# Patient Record
Sex: Female | Born: 1970
Health system: Southern US, Community
[De-identification: ages and names within clinical notes are randomized; demographics above are authoritative.]

## PROBLEM LIST (undated history)

## (undated) DIAGNOSIS — B009 Herpesviral infection, unspecified: Secondary | ICD-10-CM

## (undated) DIAGNOSIS — R112 Nausea with vomiting, unspecified: Secondary | ICD-10-CM

## (undated) DIAGNOSIS — F32A Depression, unspecified: Secondary | ICD-10-CM

## (undated) DIAGNOSIS — K8020 Calculus of gallbladder without cholecystitis without obstruction: Secondary | ICD-10-CM

## (undated) DIAGNOSIS — K831 Obstruction of bile duct: Secondary | ICD-10-CM

## (undated) DIAGNOSIS — Z9889 Other specified postprocedural states: Secondary | ICD-10-CM

## (undated) DIAGNOSIS — B019 Varicella without complication: Secondary | ICD-10-CM

## (undated) DIAGNOSIS — F329 Major depressive disorder, single episode, unspecified: Secondary | ICD-10-CM

## (undated) DIAGNOSIS — M810 Age-related osteoporosis without current pathological fracture: Secondary | ICD-10-CM

## (undated) HISTORY — PX: BREAST BIOPSY: SHX20

## (undated) HISTORY — PX: TUBAL LIGATION: SHX77

## (undated) HISTORY — DX: Varicella without complication: B01.9

## (undated) HISTORY — DX: Age-related osteoporosis without current pathological fracture: M81.0

## (undated) HISTORY — PX: WISDOM TOOTH EXTRACTION: SHX21

## (undated) HISTORY — DX: Depression, unspecified: F32.A

## (undated) HISTORY — DX: Herpesviral infection, unspecified: B00.9

## (undated) HISTORY — PX: APPENDECTOMY: SHX54

---
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
## 1999-10-14 ENCOUNTER — Other Ambulatory Visit: Admission: RE | Admit: 1999-10-14 | Discharge: 1999-10-14 | Payer: Self-pay | Admitting: Obstetrics and Gynecology

## 2000-01-21 ENCOUNTER — Other Ambulatory Visit: Admission: RE | Admit: 2000-01-21 | Discharge: 2000-01-21 | Payer: Self-pay | Admitting: Obstetrics and Gynecology

## 2000-10-20 ENCOUNTER — Other Ambulatory Visit: Admission: RE | Admit: 2000-10-20 | Discharge: 2000-10-20 | Payer: Self-pay | Admitting: Obstetrics and Gynecology

## 2001-10-27 ENCOUNTER — Other Ambulatory Visit: Admission: RE | Admit: 2001-10-27 | Discharge: 2001-10-27 | Payer: Self-pay | Admitting: Obstetrics and Gynecology

## 2002-07-14 HISTORY — PX: APPENDECTOMY: SHX54

## 2002-07-17 ENCOUNTER — Encounter: Payer: Self-pay | Admitting: Emergency Medicine

## 2002-07-17 ENCOUNTER — Observation Stay (HOSPITAL_COMMUNITY): Admission: EM | Admit: 2002-07-17 | Discharge: 2002-07-18 | Payer: Self-pay | Admitting: Emergency Medicine

## 2002-07-18 ENCOUNTER — Encounter (INDEPENDENT_AMBULATORY_CARE_PROVIDER_SITE_OTHER): Payer: Self-pay | Admitting: *Deleted

## 2002-08-04 ENCOUNTER — Other Ambulatory Visit: Admission: RE | Admit: 2002-08-04 | Discharge: 2002-08-04 | Payer: Self-pay | Admitting: Obstetrics and Gynecology

## 2003-03-06 ENCOUNTER — Inpatient Hospital Stay (HOSPITAL_COMMUNITY): Admission: AD | Admit: 2003-03-06 | Discharge: 2003-03-09 | Payer: Self-pay | Admitting: Obstetrics and Gynecology

## 2003-03-20 ENCOUNTER — Encounter: Admission: RE | Admit: 2003-03-20 | Discharge: 2003-04-19 | Payer: Self-pay | Admitting: Obstetrics and Gynecology

## 2003-04-12 ENCOUNTER — Other Ambulatory Visit: Admission: RE | Admit: 2003-04-12 | Discharge: 2003-04-12 | Payer: Self-pay | Admitting: Obstetrics and Gynecology

## 2003-12-13 ENCOUNTER — Other Ambulatory Visit: Admission: RE | Admit: 2003-12-13 | Discharge: 2003-12-13 | Payer: Self-pay | Admitting: Obstetrics and Gynecology

## 2004-06-17 ENCOUNTER — Inpatient Hospital Stay (HOSPITAL_COMMUNITY): Admission: AD | Admit: 2004-06-17 | Discharge: 2004-06-21 | Payer: Self-pay | Admitting: Obstetrics and Gynecology

## 2004-06-18 ENCOUNTER — Encounter (INDEPENDENT_AMBULATORY_CARE_PROVIDER_SITE_OTHER): Payer: Self-pay | Admitting: *Deleted

## 2004-07-29 ENCOUNTER — Other Ambulatory Visit: Admission: RE | Admit: 2004-07-29 | Discharge: 2004-07-29 | Payer: Self-pay | Admitting: Obstetrics and Gynecology

## 2005-09-15 ENCOUNTER — Other Ambulatory Visit: Admission: RE | Admit: 2005-09-15 | Discharge: 2005-09-15 | Payer: Self-pay | Admitting: Obstetrics and Gynecology

## 2008-07-14 HISTORY — PX: TUBAL LIGATION: SHX77

## 2009-04-25 ENCOUNTER — Emergency Department (HOSPITAL_BASED_OUTPATIENT_CLINIC_OR_DEPARTMENT_OTHER): Admission: EM | Admit: 2009-04-25 | Discharge: 2009-04-25 | Payer: Self-pay | Admitting: Emergency Medicine

## 2010-01-11 ENCOUNTER — Ambulatory Visit (HOSPITAL_COMMUNITY): Admission: RE | Admit: 2010-01-11 | Discharge: 2010-01-11 | Payer: Self-pay | Admitting: Obstetrics and Gynecology

## 2010-03-21 ENCOUNTER — Encounter: Admission: RE | Admit: 2010-03-21 | Discharge: 2010-03-21 | Payer: Self-pay | Admitting: Obstetrics and Gynecology

## 2010-03-26 ENCOUNTER — Encounter: Admission: RE | Admit: 2010-03-26 | Discharge: 2010-03-26 | Payer: Self-pay | Admitting: Obstetrics and Gynecology

## 2010-09-29 LAB — SURGICAL PCR SCREEN: MRSA, PCR: NEGATIVE

## 2010-09-29 LAB — CBC
HCT: 39.4 % (ref 36.0–46.0)
Hemoglobin: 13.4 g/dL (ref 12.0–15.0)
MCHC: 34 g/dL (ref 30.0–36.0)
MCV: 97.5 fL (ref 78.0–100.0)
RDW: 13.2 % (ref 11.5–15.5)
WBC: 6.2 10*3/uL (ref 4.0–10.5)

## 2010-11-29 NOTE — Discharge Summary (Signed)
NAME:  [name], [name] NO.:  [id number]   MEDICAL RECORD NO.:  [id number]                   PATIENT TYPE:   LOCATION:                                       FACILITY:   PHYSICIAN:  Duke Salvia. Marcelle Overlie, M.D.            DATE OF BIRTH:   DATE OF ADMISSION:  03/06/2003  DATE OF DISCHARGE:  03/09/2003                                 DISCHARGE SUMMARY   ADMITTING DIAGNOSES:  1. Intrauterine pregnancy at 40 5/7 weeks estimated gestational age.  2. Active labor.   DISCHARGE DIAGNOSES:  1. Status post low-transverse cesarean section secondary to cephalopelvic     disproportion.  2. Viable female infant.   PROCEDURE:  Primary low-transverse cesarean section.   REASON FOR ADMISSION:  Please see written H&P.   HOSPITAL COURSE:  Patient was a 40 year old married female, gravida 2, para  2, that was admitted to [facility] at 40 5/7 weeks who was  admitted initially for induction of labor.  Patient did present to the  hospital with spontaneous onset of labor with regular contractions.  Cervix  was noted to be 4 cm dilated.  Artificial rupture of membranes was performed  which revealed light meconium-stained fluid.  Fetal heart tones were  reassuring in the 130s.  Patient did progress to 7 cm dilated, completely  effaced, at a -2 station.  After several more hours of labor, decision was  made to proceed with a cesarean delivery for what was thought to be  cephalopelvic disproportion.  Patient was transferred to the operating room  where epidural anesthesia was dosed to an adequate surgical level.  A low-  transverse incision was made with delivery of a viable female infant weighing  8 pounds 13 ounces with Apgars of 9 at 1 minute and 9 at 5 minutes.  Patient  tolerated procedure well and was taken to the recovery room in stable  condition.  On postoperative day one, vital signs were stable.  Patient was  afebrile.  Abdomen was soft with good return  of bowel function.  Abdominal  dressing was noted to be clean, dry, and intact.  Fundus was firm and  nontender.  Labs revealed a hemoglobin of 11.8, platelet count 116,000, and  WBC count 16.2.  On postoperative day two, patient was without complaint.  Vital signs were stable.  She remained afebrile.  Abdomen was slightly  distended; however, she was having good return of bowel function.  Fundus  was firm and nontender.  She was ambulating well and tolerating a regular  diet without complaints such as nausea and vomiting.  On postoperative day  three, patient was doing well.  Vital signs were stable.  Abdomen was soft.  Fundus was firm and nontender.  Incision was clean, dry, and intact.  Staples were removed and patient was discharged home.   CONDITION ON DISCHARGE:  Good.   DIET:  Regular as tolerated.   ACTIVITY:  No heavy lifting.  No driving x2 weeks.  No vaginal entry.   FOLLOWUP:  Patient is to follow up in the office in one to two weeks for an  incision check.  She is to call for a temperature greater than 100 degrees,  persistent nausea and vomiting, heavy vaginal bleeding, and/or redness or  drainage from the incisional site.    DISCHARGE MEDICATIONS:  1. Percocet 5/325, #30, one p.o. four to six hours as needed.  2. Motrin 600 mg q.6h.  3. Prenatal vitamins one p.o. daily.  4. Colace one p.o. daily as needed.     Julio Sicks, N.P.                        Richard M. Marcelle Overlie, M.D.    CC/MEDQ  D:  03/22/2003  T:  03/22/2003  Job:  [id number]

## 2010-11-29 NOTE — Op Note (Signed)
[name], [name]                       ACCOUNT NO.:  [id number]   MEDICAL RECORD NO.:  [id number]                   PATIENT TYPE:  INP   LOCATION:  9109                                 FACILITY:  [facility]   PHYSICIAN:  Tracie Harrier, M.D.              DATE OF BIRTH:  May 23, 1971   DATE OF PROCEDURE:  DATE OF DISCHARGE:                                 OPERATIVE REPORT   PREOPERATIVE DIAGNOSES:  1. Intrauterine pregnancy at 40-5/7 weeks.  2. Cephalopelvic disproportion.  3. Light meconium.   POSTOPERATIVE DIAGNOSES:  1. Intrauterine pregnancy at 40-5/7 weeks.  2. Cephalopelvic disproportion.  3. Light meconium.   PROCEDURE:  Primary low transverse cesarean section.   SURGEON:  Tracie Harrier, M.D.   ANESTHESIA:  Epidural.   ESTIMATED BLOOD LOSS:  800 mL.   COMPLICATIONS:  None.   FINDINGS:  At the time of surgery, a viable female infant was delivered from  the vertex presentation.  The baby was a female, delivered at 5:32 p.m.,  weighing 8 lb., 13 oz.  Apgars were 9 and 9.   The pelvis was visualized at the time of surgery and noted to be normal.   PROCEDURE:  The patient was taken to the operating room where an adequate  level of epidural anesthetic was administered.  The patient was then placed  on the operating table in the left lateral tilt position, a Foley catheter  had previously been inserted.  The abdomen was then entered through a  Pfannenstiel incision and carried down sharply in the usual fashion.  The  peritoneum was atraumatically entered.  The vesicouterine peritoneum  overlying the lower uterine segment was incised, and a bladder flap was  bluntly and sharply created over the lower uterine segment.  A bladder blade  was then placed behind the bladder flap to ensure its protection during the  procedure.  The uterus was then entered through a low transverse incision  and carried out laterally using the operator's fingers.  Only scant light  meconium  was noted upon entry.  The vertex was elevated into the incision  and delivered promptly and easily at 17:32.  The oronasopharynx was  thoroughly suctioned with the DeLee suction, and only clear fluid was  aspirated.  No meconium was aspirated from the oronasopharynx as I could  see.  The baby was then promptly and easily delivered without difficulty.  The cord was doubly clamped and cut, and the baby was handed promptly to the  pediatricians.  Apgars were 9 and 9.  The baby did well.  The baby was a  female weighing 8 lb, 13 oz.   The placenta was then manually extracted intact with a three-vessel cord  without difficulty.  The interior of the uterus was wiped clean with a wet  sponge.  The uterine incision was then closed in a two-layered fashion, the  first layer a running interlocking suture of #  1 Vicryl.  A second  imbricating suture was placed across the primary suture line with a running  suture of #1 Vicryl as well.  The pelvis was thoroughly irrigated with  copious amounts of irrigant.  Hemostasis was noted.  The pelvis was also  visualized and noted to be normal.   The rectus muscle and anterior peritoneum were then closed with a running  suture of #1 Vicryl.  The subfascial layers were visualized and noted to be  hemostatic.  The fascia was then closed with a running suture of 0 PDS.  The  subcutaneous tissue was then irrigated and made hemostatic using the Bovie  cautery.  The skin reapproximated with staples and a sterile dressing  applied.   Final sponge, needle, and instrument count was correct x3.  There were no  perioperative complications.  The baby did well.  The patient also received  an intravenous antibiotic after delivery.                                               Tracie Harrier, M.D.    REG/MEDQ  D:  03/06/2003  T:  03/07/2003  Job:  [id number]

## 2010-11-29 NOTE — Discharge Summary (Signed)
NAMEJAIRY, [name]           ACCOUNT NO.:  [id number]   MEDICAL RECORD NO.:  [id number]          PATIENT TYPE:  INP   LOCATION:  9144                          FACILITY:  [facility]   PHYSICIAN:  Dineen Kid. Rana Snare, M.D.    DATE OF BIRTH:  1970/08/27   DATE OF ADMISSION:  06/17/2004  DATE OF DISCHARGE:  06/21/2004                                 DISCHARGE SUMMARY   ADMISSION DIAGNOSES:  1.  Intrauterine pregnancy at 36-1/2 weeks estimated gestational age.  2.      Pregnancy induced hypertension/HELLP syndrome.   DISCHARGE DIAGNOSES:  1.  Status post low transverse cesarean section.  2. Viable female infant.   PROCEDURE:  Repeat low transverse cesarean section.   REASON FOR ADMISSION:  Please see written H&P.   HOSPITAL COURSE:  The patient is a 40 year old white married female, gravida  3, para 3 with a history of twins that was admitted to [facility] at  36-1/2 weeks estimated gestational age for induction of labor.  The patient  had had a previous cesarean section and had been counseled regarding risks  associated with vaginal versus cesarean.  On the evening of admission,  Cervidil was placed.  However, the following morning after view of  laboratories and cervix being closed and thick, the decision was made to  proceed with a cesarean delivery.  The patient was then transferred to the  operating room where spinal anesthesia was administered without difficulty.  A low transverse incision was made and delivered a viable female infant  weighing 7 pounds 14 ounces with Apgars of 8 at 1 minute and 9 at 5 minutes.  Arterial cord pH was 7.28.  The patient tolerated the procedure and was  taken to the recovery room in stable condition.  After approximately two  hours in the recovery room, the patient was then transferred to the AICU  where she continued her magnesium sulfate.  On the following morning, vital  signs were stable.  Blood pressure is 120s to 130s over 80s to 90s.  Urine  output was good at 500-800 mL/hour.  Abdomen was soft with good return of  bowel function.  Fundus is firm and nontender.  Deep tendon reflexes are 2+  without clonus.   Laboratories reveal platelets of 119,000, creatinine of 1.1, SGOT 174, SGPT  is 218.  Magnesium sulfate was decreased to 1.5 g/hour.  She continued to be  monitored closely.  Later that evening, vital signs were stable.  Blood  pressure 130/90.  Urine output was 1100 mL over the last eight hours.  Laboratories revealed hemoglobin of 12.1, platelet count was up to 125,000.  WBC count was 11.8, SGOT was 123, and SGPT was 166.  The patient was stable  and was later transferred to the mother/baby unit.  On postoperative day #2,  the patient was without complaints.  She denied headache, blurred vision, or  right upper quadrant  pain.  Vital signs remained stable.  Blood pressure  124/86 to 129/91.  Deep tendon reflexes are 2+ without clonus.  Abdomen is  soft.  Fundus is firm and nontender.  Abdominal  dressing had been removed  revealing an incision that was clean, dry, and intact.  Laboratories  revealed hemoglobin of 11.8, platelet count was up to 137,000.  AST was down  to 73, ALT was down to 110.  LDH was down to 238.  Urine output was 1040 mL  over the last eight hours.  On postoperative day #3, the patient was without  complaint.  Vital signs remained stable.  Blood pressure was now 124/75.  Abdomen was soft.  Fundus was firm and nontender.  Incision was clean, dry,  and intact.  Staples removed.  The patient was discharged home.   CONDITION ON DISCHARGE:  Good.   DIET:  Regular as tolerated.   ACTIVITY:  No heavy lifting, no driving x 2 weeks.  No vaginal entry.   FOLLOW UP:  The patient is to follow up in the office in three to four days  for laboratories and blood pressure check.  She is to call for temperature  greater than 100 degrees, persistent nausea and vomiting, heavy vaginal  bleeding and/or redness or  drainage from the incisional site.  The patient  was also encouraged to call for headaches unrelieved by Tylenol, right upper  quadrant pain, or feeling jittery.   DISCHARGE MEDICATIONS:  1.  Tylox #30, 1 p.o. q.4-6h p.r.n.  2. Motrin 600 mg every 6 hours.  3.      Prenatal vitamins 1 p.o. daily.  4. Colace 1 p.o. daily p.r.n.     Caro   CC/MEDQ  D:  07/19/2004  T:  07/19/2004  Job:  [id number]

## 2010-11-29 NOTE — Op Note (Signed)
NAMELASONDRA, [name] Ingram           ACCOUNT NO.:  [id number]   MEDICAL RECORD NO.:  [id number]          PATIENT TYPE:  INP   LOCATION:  9374                          FACILITY:  [facility]   PHYSICIAN:  Guy Sandifer. Tomblin II, M.D.DATE OF BIRTH:  1971/03/29   DATE OF PROCEDURE:  06/18/2004  DATE OF DISCHARGE:                                 OPERATIVE REPORT   PREOPERATIVE DIAGNOSES:  1.  Intrauterine pregnancy at 54 1/2 weeks estimated gestational age.  2.  Previous cesarean section.  3.  Preeclampsia.   POSTOPERATIVE DIAGNOSES:  1.  Intrauterine pregnancy at 43 1/2 weeks estimated gestational age.  2.  Previous cesarean section.  3.  Preeclampsia.   PROCEDURE:  Repeat low transverse cesarean section.   SURGEON:  Guy Sandifer. Henderson Cloud, M.D.   ANESTHESIA:  Spinal, Quillian Quince, M.D.   ESTIMATED BLOOD LOSS:  750 mL.   SPECIMENS:  Placenta to pathology.   FINDINGS:  Viable female infant, Apgar's of 8 and 9 at 1 and 5 minutes  respectively.  Birth weight 7 pounds 14 ounces, arterial cord pH 7.28.   INDICATIONS AND CONSENTS:  This patient is a 40 year old, married white  female, G3, P3 (twins) with a due date of July 11, 2004 placing at 27  1/2 weeks estimated gestational age.  Prenatal care was complicated by a  history of a low transverse cesarean section with pregnancy #2 and a history  of preeclampsia with her first pregnancy with twins.  In the office, she is  noted to have proteinuria of  1+ and increased blood pressures.  With rest,  the protein resolved to trace.  However, laboratories were noted to have a  elevated liver functions, creatinine and uric acid. She was admitted last  p.m. for induction of labor.  Issues of VBAC have been carefully discussed  with the patient prior to that.  Cervidil is placed.  Blood pressures this  morning were 130's/80's.  Contractions were approximately 2-4 minutes apart,  moderate to palpation. The cervix was closed and thick. Laboratories  noted  liver functions of approximately 200, uric acid of 6.6, creatinine of 1.1  and platelets of 120,000.  LDH was 252.  Recommendation for delivery with  repeat cesarean section is made.  The potential risks and complications are  discussed preoperatively with the patient and her husband including but not  limited to infection, organ damage, bleeding and transfusion of blood  products, DVT, PE and pneumonia.  All questions are answered and consent is  signed on the chart.   DESCRIPTION OF PROCEDURE:  The patient was taken to the operating room where  she was identified, spinal anesthetic was placed and she was placed in the  dorsal supine position with a 15 degree left lateral wedge. She was then  prepped abdominally, Foley catheter was placed, the bladder was drained and  she was draped in a sterile fashion.  After testing for adequate spinal  anesthesia, the skin is entered through a Pfannenstiel incision by resecting  the old scar on the way in. Dissection is carried out in layers to the  peritoneum.  The  peritoneum is entered and extended superiorly and  inferiorly. The vesicouterine peritoneum is taken down cephalolaterally, the  bladder flap is developed and bladder blade is placed.  The uterus is then  incised in a low transverse fashion. The uterine cavity is entered bluntly  with a hemostat and the uterine incision is extended cephalolaterally with  the fingers.  Artificial rupture of the membranes with clear fluid is  carried out. The vertex is delivered and the oronasopharynx are thoroughly  suctioned.  The remainder of the baby is delivered and good cry and tone is  noted.  The cord is clamped and cut and baby is handed to the waiting  pediatrics team.  The placenta is manually delivered and sent to pathology.  The uterine cavity is cleaned.  The uterus is closed in two running locking  imbricating layers of #0 Monocryl suture which achieves good hemostasis. The  tubes and  ovaries palpate normally bilaterally. The anterior peritoneum is  closed in running fashion with #0 Monocryl suture which is also used to  reapproximate the pyramidalis muscle of the midline.  The anterior rectus  fascia is closed in a running fashion with #0 PDS suture and the skin is  closed with clips. All sponge, instrument and needle counts are correct and  the patient is transferred to the recovery room in stable condition.      JET/MEDQ  D:  06/18/2004  T:  06/18/2004  Job:  [id number]

## 2010-11-29 NOTE — Op Note (Signed)
[name], [name]                       ACCOUNT NO.:  [id number]   MEDICAL RECORD NO.:  [id number]                   PATIENT TYPE:  INP   LOCATION:  1823                                 FACILITY:  [facility]   PHYSICIAN:  Jimmye Norman III, M.D.               DATE OF BIRTH:  [birth year]   DATE OF PROCEDURE:  07/17/2002  DATE OF DISCHARGE:                                 OPERATIVE REPORT   PREOPERATIVE DIAGNOSIS:  Acute appendicitis at seven weeks pregnancy.   POSTOPERATIVE DIAGNOSIS:  Acute appendicitis at seven weeks pregnancy.   PROCEDURE:  Laparoscopic appendectomy.   SURGEON:  Jimmye Norman, M.D.   ASSISTANT:  None.   ANESTHESIA:  General endotracheal.   ESTIMATED BLOOD LOSS:  Less than 20 cc.   COMPLICATIONS:  None.   CONDITION:  Stable.   SPECIMENS:  Acutely inflamed appendix.   FINDINGS:  Acutely inflamed appendix without evidence of perforation.   INDICATIONS FOR PROCEDURE:  The patient is 31, seven weeks pregnant,  abdominal pain with nausea, vomiting, elevated white count and right lower  quadrant tenderness and a CT scan demonstrating acute appendicitis.   OPERATION:  The patient was taken to the operating room and placed on the  table in supine position.  After adequate endotracheal anesthetic was  administered she was prepped and draped in usual sterile manner and exposed  in the midline.   The patient had had a previous supraumbilical piercing which was included in  part of the supraumbilical transverse curvilinear incision down to the  midline fascia at the supraumbilical site.  It was through this site at the  fascia level that a Veress needle was passed into the peritoneal cavity  confirming its position using the saline test.  Once this was done, carbon  dioxide insufflation was instilled into the peritoneal cavity up to maximal  intraabdominal pressure of 15 mmHg.  Once this was done, right upper  quadrant costal margin 5-mm cannula and a suprapubic  11 to 12-mm cannula  passed into the peritoneal cavity under direct vision.  Extreme care was  taken in the suprapubic cannula site not to injure or even touch the uterus  upon entrance or during manipulation, and there was no problem with this at  all during the procedure.   The patient was placed in the Trendelenburg position, the left side was  tilted down.  The acutely inflamed appendix was seen in the right pericolic  area and it was dissected free from its mesoappendix at the base of the  cecum.  An ENDO-GIA with 3.5 mm stapler, __________ blue stapler was passed  across the base of the appendix and then stapled across detaching the  appendix from the base of the cecum.  We then used a 2.5 mm White  Endostapler across the base of the mesoappendix stapling it across and then  retracted the appendix out of the abdominal cavity using an EndoCatch bag  passed through the suprapubic cannula site.  Once this was done we irrigated  the right lower quadrant with a liter of saline solution.  There was minimal  bleeding and no cautery had to be used.  We inspected the uterus which did  not appear to be injured and seemed to be of appropriate size for this  pregnancy of stated gestation.   Once we had done this we allowed all gas to escape from the cannula sites.  We removed all cannulas.  The supraumbilical fascia was reapproximated using  a figure-of-eight stitch of 0 Vicryl.  The skin was closed using a running  subcuticular stitch of 4-0 Vicryl.  Then 0.25% Marcaine with epinephrine was  injected at all sites.  All needle counts, sponge counts and instrument  counts were correct.                                               Kathrin Ruddy, M.D.    JW/MEDQ  D:  07/17/2002  T:  07/17/2002  Job:  [id number]

## 2011-10-02 ENCOUNTER — Ambulatory Visit (HOSPITAL_BASED_OUTPATIENT_CLINIC_OR_DEPARTMENT_OTHER): Payer: Managed Care, Other (non HMO) | Admitting: Genetic Counselor

## 2011-10-02 ENCOUNTER — Ambulatory Visit: Payer: Managed Care, Other (non HMO) | Admitting: Lab

## 2011-10-02 ENCOUNTER — Emergency Department (HOSPITAL_BASED_OUTPATIENT_CLINIC_OR_DEPARTMENT_OTHER)
Admission: EM | Admit: 2011-10-02 | Discharge: 2011-10-03 | Disposition: A | Discharge status: Home / Self Care | Payer: Managed Care, Other (non HMO) | Source: Home / Self Care | Attending: Emergency Medicine | Admitting: Emergency Medicine

## 2011-10-02 ENCOUNTER — Encounter (HOSPITAL_BASED_OUTPATIENT_CLINIC_OR_DEPARTMENT_OTHER): Payer: Self-pay | Admitting: *Deleted

## 2011-10-02 DIAGNOSIS — Z800 Family history of malignant neoplasm of digestive organs: Secondary | ICD-10-CM

## 2011-10-02 DIAGNOSIS — R110 Nausea: Secondary | ICD-10-CM | POA: Insufficient documentation

## 2011-10-02 DIAGNOSIS — R10811 Right upper quadrant abdominal tenderness: Secondary | ICD-10-CM | POA: Insufficient documentation

## 2011-10-02 DIAGNOSIS — R1011 Right upper quadrant pain: Secondary | ICD-10-CM | POA: Insufficient documentation

## 2011-10-02 DIAGNOSIS — K8020 Calculus of gallbladder without cholecystitis without obstruction: Secondary | ICD-10-CM | POA: Insufficient documentation

## 2011-10-02 DIAGNOSIS — M549 Dorsalgia, unspecified: Secondary | ICD-10-CM | POA: Insufficient documentation

## 2011-10-02 LAB — URINALYSIS, ROUTINE W REFLEX MICROSCOPIC
Bilirubin Urine: NEGATIVE
Leukocytes, UA: NEGATIVE
Nitrite: NEGATIVE
Specific Gravity, Urine: 1.029 (ref 1.005–1.030)
Urobilinogen, UA: 0.2 mg/dL (ref 0.0–1.0)
pH: 5.5 (ref 5.0–8.0)

## 2011-10-02 NOTE — ED Notes (Signed)
Pt began having upper abd pain that radiates to her back denies N/V

## 2011-10-02 NOTE — Progress Notes (Signed)
Dr.  Henderson Cloud requested a consultation for genetic counseling and risk assessment for [name], a 41 y.o. female, for discussion of her family history of a PMS2 mutation for Lynch syndrome. She presents to clinic today to discuss the possibility of a genetic predisposition to cancer, and to further clarify her risks.   HISTORY OF PRESENT ILLNESS:  [name] is a 41 y.o. female with no personal history of cancer.     History  Substance Use Topics  . Smoking status: Not on file  . Smokeless tobacco: Not on file  . Alcohol Use: Not on file    PERSONAL RISK ASSESSMENT FACTORS: Uterus Intact: Yes Ovaries Intact: Yes Colonoscopy: No   FAMILY HISTORY:  We obtained a detailed, 4-generation family history.  Significant diagnoses are listed below: Patient's mother was diagnosed with colon cancer at age 20.  Genetic testing was performed and she was found to have a MMR mutation in the PMS2 gene.  Specifically, the mutation was [id number]. There is no other reported cancer history on the patients maternal or paternal side of the family.  GENETIC COUNSELING RISK ASSESSMENT, DISCUSSION, AND SUGGESTED FOLLOW UP: We reviewed the natural history and genetic etiology of sporadic, familial and hereditary cancer syndromes.  We discussed the red flags for Lynch syndrome including the related cancers, potential early age of diagnoses, and multiple generations affected.  We reviewed the dominant inheritance pattern for PMS2 mutations, and other family members who are at risk.  The patient's family history is suggestive of the following possible diagnosis: Lynch Syndrome  We discussed that identification of a hereditary cancer syndrome may help her care providers tailor the patients medical management. If a mutation indicating Lynch syndrome is detected in this case, the [employer] recommendations would include increased cancer screenings and discussion of  prophylactic surgery. If a mutation is detected, the patient will be referred back to the referring provider and to any additional appropriate care providers to discuss the relevant options.   If a mutation is not found in the patient, then she would not have inherited the gene mutation for Lynch syndrome that is found in her family.  We would recommend the general cancer screening guidelines as recommended by the [facility] and American Cancer Society guidelines, with consideration of their personal and family history risk factors. In this case, the patient will be referred back to their care providers for discussions of management.   Based on the patient's family history, her risk of inheriting a PMS2 mutation is 50%.  After considering the risks, benefits, and limitations, the patient provided informed consent for  the following  Testing: Single site Lynch syndrome testing of the PMS2 gene through [facility].   Per the patient's request, we will contact her by telephone to discuss these results. A follow up genetic counseling visit will be scheduled if indicated.  The patient was seen for a total of 60 minutes, greater than 50% of which was spent face-to-face counseling.  This plan is being carried out per Dr. Huel Coventry recommendations.  This note will also be sent to the referring provider via the electronic medical record. The patient will be supplied with a summary of this genetic counseling discussion as well as educational information on the discussed hereditary cancer syndromes following the conclusion of their visit.   Patient was discussed with Dr. Drue Second.  EDUCATIONAL INFORMATION SUPPLIED TO PATIENT AT ENCOUNTER:  Lynch Syndrome Brochure   _______________________________________________________________________ For Office Staff:  Number of people involved in session: 2 Was an Intern/ student involved with case: not applicable }

## 2011-10-03 ENCOUNTER — Emergency Department (HOSPITAL_COMMUNITY): Payer: Managed Care, Other (non HMO)

## 2011-10-03 ENCOUNTER — Encounter (HOSPITAL_COMMUNITY): Payer: Self-pay | Admitting: *Deleted

## 2011-10-03 ENCOUNTER — Emergency Department (INDEPENDENT_AMBULATORY_CARE_PROVIDER_SITE_OTHER): Payer: Managed Care, Other (non HMO)

## 2011-10-03 ENCOUNTER — Emergency Department (HOSPITAL_COMMUNITY)
Admission: EM | Admit: 2011-10-03 | Discharge: 2011-10-03 | Disposition: A | Discharge status: Home / Self Care | Payer: Managed Care, Other (non HMO) | Attending: Emergency Medicine | Admitting: Emergency Medicine

## 2011-10-03 DIAGNOSIS — R109 Unspecified abdominal pain: Secondary | ICD-10-CM

## 2011-10-03 DIAGNOSIS — K8020 Calculus of gallbladder without cholecystitis without obstruction: Secondary | ICD-10-CM

## 2011-10-03 DIAGNOSIS — K8050 Calculus of bile duct without cholangitis or cholecystitis without obstruction: Secondary | ICD-10-CM

## 2011-10-03 LAB — BASIC METABOLIC PANEL
BUN: 13 mg/dL (ref 6–23)
Chloride: 102 mEq/L (ref 96–112)
GFR calc Af Amer: >90 mL/min (ref >90)
GFR calc non Af Amer: >90 mL/min (ref >90)
Potassium: 4.2 mEq/L (ref 3.5–5.1)
Sodium: 138 mEq/L (ref 135–145)

## 2011-10-03 LAB — CBC
HCT: 35.3 % — ABNORMAL LOW (ref 36.0–46.0)
Hemoglobin: 12.2 g/dL (ref 12.0–15.0)
MCV: 90.7 fL (ref 78.0–100.0)
RBC: 3.89 MIL/uL (ref 3.87–5.11)
WBC: 10.9 10*3/uL — ABNORMAL HIGH (ref 4.0–10.5)

## 2011-10-03 LAB — HEPATIC FUNCTION PANEL
ALT: 16 U/L (ref 0–35)
AST: 23 U/L (ref 0–37)
Albumin: 4.3 g/dL (ref 3.5–5.2)
Alkaline Phosphatase: 72 U/L (ref 39–117)
Total Protein: 7.5 g/dL (ref 6.0–8.3)

## 2011-10-03 LAB — LIPASE, BLOOD: Lipase: 16 U/L (ref 11–59)

## 2011-10-03 MED ORDER — OXYCODONE-ACETAMINOPHEN 5-325 MG PO TABS
2.0000 | ORAL_TABLET | Freq: Once | ORAL | Status: AC
  Administered 2011-10-03: 2 via ORAL
  Filled 2011-10-03: qty 2

## 2011-10-03 MED ORDER — ONDANSETRON HCL 4 MG/2ML IJ SOLN
4.0000 mg | Freq: Once | INTRAMUSCULAR | Status: AC
  Administered 2011-10-03: 4 mg via INTRAVENOUS
  Filled 2011-10-03: qty 2

## 2011-10-03 MED ORDER — ONDANSETRON HCL 4 MG PO TABS: 4.0000 mg | ORAL_TABLET | Freq: Four times a day (QID) | ORAL | Status: DC

## 2011-10-03 MED ORDER — FENTANYL CITRATE 0.05 MG/ML IJ SOLN: 100.0000 ug | Freq: Once | INTRAMUSCULAR | Status: DC

## 2011-10-03 MED ORDER — GI COCKTAIL ~~LOC~~
30.0000 mL | Freq: Once | ORAL | Status: DC
  Filled 2011-10-03: qty 30

## 2011-10-03 MED ORDER — SODIUM CHLORIDE 0.9 % IV SOLN
INTRAVENOUS | Status: DC
  Administered 2011-10-03: 1000 mL via INTRAVENOUS

## 2011-10-03 MED ORDER — GI COCKTAIL ~~LOC~~
30.0000 mL | Freq: Once | ORAL | Status: AC
  Administered 2011-10-03: 30 mL via ORAL
  Filled 2011-10-03: qty 30

## 2011-10-03 MED ORDER — FENTANYL CITRATE 0.05 MG/ML IJ SOLN
100.0000 ug | Freq: Once | INTRAMUSCULAR | Status: AC
  Administered 2011-10-03: 100 ug via INTRAVENOUS
  Filled 2011-10-03: qty 2

## 2011-10-03 MED ORDER — OXYCODONE-ACETAMINOPHEN 5-325 MG PO TABS: 1.0000 | ORAL_TABLET | Freq: Four times a day (QID) | ORAL | Status: DC | PRN

## 2011-10-03 MED ORDER — MORPHINE SULFATE 4 MG/ML IJ SOLN
4.0000 mg | Freq: Once | INTRAMUSCULAR | Status: AC
  Administered 2011-10-03: 4 mg via INTRAVENOUS
  Filled 2011-10-03: qty 1

## 2011-10-03 MED ORDER — ONDANSETRON 4 MG PO TBDP
4.0000 mg | ORAL_TABLET | Freq: Once | ORAL | Status: AC
  Administered 2011-10-03: 4 mg via ORAL
  Filled 2011-10-03: qty 1

## 2011-10-03 MED ORDER — FENTANYL CITRATE 0.05 MG/ML IJ SOLN
50.0000 ug | Freq: Once | INTRAMUSCULAR | Status: AC
  Administered 2011-10-03: 100 ug via INTRAVENOUS
  Filled 2011-10-03: qty 2

## 2011-10-03 NOTE — Discharge Instructions (Signed)

## 2011-10-03 NOTE — ED Notes (Signed)
Pt transported to XR at this time.

## 2011-10-03 NOTE — ED Notes (Signed)
Spoke to Consulting civil engineer at [employer] to expect patients arrival. [place] they will be ready for her on arrival.

## 2011-10-03 NOTE — ED Notes (Signed)
Pt c/o abdominal pain that started @ 0500 yesterday, approximately 30 minutes after eating a delicious cupcake. Pt states that the pain is epigastric in location and there is some referred pain in her LUQ.

## 2011-10-03 NOTE — ED Provider Notes (Signed)
History     CSN: [id number]  Arrival date & time 10/02/11  2231   First MD Initiated Contact with Patient 10/03/11 0001      Chief Complaint  Patient presents with  . Abdominal Pain    (Consider location/radiation/quality/duration/timing/severity/associated sxs/prior treatment) Patient is a 41 y.o. female presenting with abdominal pain. The history is provided by the patient. No language interpreter was used.  Abdominal Pain The primary symptoms of the illness include abdominal pain and nausea. The current episode started 6 to 12 hours ago. The onset of the illness was sudden. The problem has not changed since onset. The abdominal pain began 6 to 12 hours ago. The pain came on suddenly. The abdominal pain has been unchanged since its onset. The abdominal pain is located in the RUQ. Pain radiation: upper back. The severity of the abdominal pain is 10/10. The abdominal pain is relieved by nothing. Exacerbated by: nothing.  Associated with: eating a cupcake. The patient states that she believes she is currently not pregnant. The patient has not had a change in bowel habit. Risk factors for an acute abdominal problem include a history of abdominal surgery. Symptoms associated with the illness do not include chills. Significant associated medical issues do not include PUD.  No chest pain no SOB  History reviewed. No pertinent past medical history.  Past Surgical History  Procedure Date  . Tummy    . Cesarean section   . Appendectomy     History reviewed. No pertinent family history.  History  Substance Use Topics  . Smoking status: Never Smoker   . Smokeless tobacco: Not on file  . Alcohol Use: No    OB History    Grav Para Term Preterm Abortions TAB SAB Ect Mult Living                  Review of Systems  Constitutional: Negative for chills.  HENT: Negative.   Eyes: Negative.   Respiratory: Negative.   Cardiovascular: Negative.   Gastrointestinal: Positive for nausea  and abdominal pain.  Genitourinary: Negative.   Musculoskeletal: Negative.   Skin: Negative.   Neurological: Negative.   Hematological: Negative.   Psychiatric/Behavioral: Negative.     Allergies  Review of patient's allergies indicates no known allergies.  Home Medications  No current outpatient prescriptions on file.  BP 125/72  Pulse 72  Temp(Src) 98 F (36.7 C) (Oral)  Resp 16  SpO2 99%  LMP 09/22/2011  Physical Exam  Constitutional: She is oriented to person, place, and time. She appears well-developed and well-nourished. No distress.  HENT:  Head: Normocephalic and atraumatic.  Mouth/Throat: Oropharynx is clear and moist.  Eyes: Conjunctivae are normal. Pupils are equal, round, and reactive to light.  Neck: Normal range of motion. Neck supple.  Cardiovascular: Normal rate and regular rhythm.   Pulmonary/Chest: Effort normal and breath sounds normal.  Abdominal: Soft. Bowel sounds are normal. There is tenderness in the right upper quadrant. There is no rigidity and no guarding.  Musculoskeletal: Normal range of motion.  Neurological: She is alert and oriented to person, place, and time.  Skin: Skin is warm.  Psychiatric: She has a normal mood and affect.    ED Course  Procedures (including critical care time)  Labs Reviewed  BASIC METABOLIC PANEL - Abnormal; Notable for the following:    Glucose, Bld 121 (*)    All other components within normal limits  CBC - Abnormal; Notable for the following:    WBC 10.9 (*)  HCT 35.3 (*)    All other components within normal limits  HEPATIC FUNCTION PANEL - Abnormal; Notable for the following:    Total Bilirubin 0.2 (*)    All other components within normal limits  URINALYSIS, ROUTINE W REFLEX MICROSCOPIC  LIPASE, BLOOD  PREGNANCY, URINE   Dg Abd Acute W/chest  10/03/2011  *RADIOLOGY REPORT*  Clinical Data: Pain.  ACUTE ABDOMEN SERIES (ABDOMEN 2 VIEW & CHEST 1 VIEW)  Comparison: 02/26/2011  Findings: The heart size  and mediastinal contours are within normal limits.  Both lungs are clear.  The visualized skeletal structures are unremarkable.  The bowel gas pattern appears nonobstructed.  No dilated loops of small bowel or fluid levels identified.  Gas and stool identified within the colon.  IMPRESSION:  1.  Nonobstructive bowel gas pattern. 2.  No active cardiopulmonary abnormalities.  Original Report Authenticated By: Rosealee Albee, M.D.     No diagnosis found.    MDM  Case d/w Dr. Brooke Dare patient to go POV to [facility] for [place]        Leather Estis K Cuca Benassi-Rasch, MD 10/03/11 [phone]

## 2011-10-03 NOTE — ED Notes (Signed)
DR Nicanor Alcon at bedside.

## 2011-10-03 NOTE — ED Notes (Signed)
Pt to be discharged and f/u at [facility] ED tonight for f/u US of gallbladder.

## 2011-10-03 NOTE — ED Provider Notes (Signed)
History     CSN: [id number]  Arrival date & time 10/03/11  0343   First MD Initiated Contact with Patient 10/03/11 0410      Chief Complaint  Patient presents with  . Abdominal Pain    (Consider location/radiation/quality/duration/timing/severity/associated sxs/prior treatment) HPI Patient is a 41 y.o. female presenting with abdominal pain. The history is provided by the patient. No language interpreter was used.  Abdominal Pain  The primary symptoms of the illness include abdominal pain and nausea. The current episode started 6 to 12 hours ago. The onset of the illness was sudden. The problem has not changed since onset.  The abdominal pain began 6 to 12 hours ago. The pain came on suddenly. The abdominal pain has been unchanged since its onset. The abdominal pain is located in the RUQ. Pain radiation: upper back. The severity of the abdominal pain is 10/10. The abdominal pain is relieved by nothing. Exacerbated by: nothing.  Associated with: eating a cupcake. The patient states that she believes she is currently not pregnant. The patient has not had a change in bowel habit. Risk factors for an acute abdominal problem include a history of abdominal surgery. Symptoms associated with the illness do not include chills. Significant associated medical issues do not include PUD.  No chest pain no SOB  History reviewed. No pertinent past medical history.  Past Surgical History  Procedure Date  . Tummy    . Cesarean section   . Appendectomy     No family history on file.  History  Substance Use Topics  . Smoking status: Never Smoker   . Smokeless tobacco: Not on file  . Alcohol Use: No    OB History    Grav Para Term Preterm Abortions TAB SAB Ect Mult Living                  Review of Systems  Constitutional: Negative for fever, activity change, appetite change and fatigue.  HENT: Negative for congestion, sore throat, rhinorrhea, neck pain and neck stiffness.   Respiratory:  Negative for cough and shortness of breath.   Cardiovascular: Negative for chest pain and palpitations.  Gastrointestinal: Positive for nausea and abdominal pain. Negative for vomiting.  Genitourinary: Negative for dysuria, urgency, frequency and flank pain.  Musculoskeletal: Positive for back pain. Negative for myalgias and arthralgias.  Neurological: Negative for dizziness, weakness, light-headedness, numbness and headaches.  All other systems reviewed and are negative.    Allergies  Review of patient's allergies indicates no known allergies.  Home Medications   Current Outpatient Rx  Name Route Sig Dispense Refill  . ONDANSETRON HCL 4 MG PO TABS Oral Take 1 tablet (4 mg total) by mouth every 6 (six) hours. 12 tablet 0  . OXYCODONE-ACETAMINOPHEN 5-325 MG PO TABS Oral Take 1-2 tablets by mouth every 6 (six) hours as needed for pain. 20 tablet 0    BP 129/80  Pulse 60  Temp(Src) 98.7 F (37.1 C) (Oral)  Resp 20  Wt 140 lb (63.504 kg)  SpO2 99%  LMP 09/22/2011  Physical Exam Constitutional: She is oriented to person, place, and time. She appears well-developed and well-nourished. No distress.  HENT:  Head: Normocephalic and atraumatic.  Mouth/Throat: Oropharynx is clear and moist.  Eyes: Conjunctivae are normal. Pupils are equal, round, and reactive to light.  Neck: Normal range of motion. Neck supple.  Cardiovascular: Normal rate and regular rhythm.  Pulmonary/Chest: Effort normal and breath sounds normal.  Abdominal: Soft. Bowel sounds are normal.  There is tenderness in the right upper quadrant. There is no rigidity and no guarding.  Musculoskeletal: Normal range of motion.  Neurological: She is alert and oriented to person, place, and time.  Skin: Skin is warm.  Psychiatric: She has a normal mood and affect.  ED Course  Procedures (including critical care time)  Labs Reviewed - No data to display US Abdomen Complete  10/03/2011  *RADIOLOGY REPORT*  Clinical Data:   Abdominal pain.  ABDOMINAL ULTRASOUND COMPLETE  Comparison:  CT of the abdomen and pelvis performed 02/26/2011  Findings:  Gallbladder:  A wall echo shadow sign is noted, with numerous stones filling the gallbladder.  The gallbladder is not well assessed as a result.  No ultrasonographic Murphy's sign is elicited.  No pericholecystic fluid is seen.  Common Bile Duct:  0.5 cm in diameter; within normal limits in caliber.  Liver:  Grossly normal parenchymal echogenicity and echotexture; no focal lesions identified.  Limited Doppler evaluation demonstrates normal blood flow within the liver.  IVC:  Unremarkable in appearance.  Pancreas:  Although the pancreas is difficult to visualize in its entirety due to overlying bowel gas, no focal pancreatic abnormality is identified.  Spleen:  9.1 cm in length; within normal limits in size and echotexture.  Right kidney:  11.5 cm in length; normal in size, configuration and parenchymal echogenicity.  No evidence of mass or hydronephrosis.  Left kidney:  11.3 cm in length; normal in size, configuration and parenchymal echogenicity.  No evidence of mass or hydronephrosis.  Abdominal Aorta:  Normal in caliber; no aneurysm identified.  IMPRESSION: Wall echo shadow sign noted at the gallbladder, reflecting numerous stones filling the gallbladder.  No evidence for obstruction or cholecystitis.  No ultrasonographic Murphy's sign elicited.  Original Report Authenticated By: Tonia Ghent, M.D.   Dg Abd Acute W/chest  10/03/2011  *RADIOLOGY REPORT*  Clinical Data: Pain.  ACUTE ABDOMEN SERIES (ABDOMEN 2 VIEW & CHEST 1 VIEW)  Comparison: 02/26/2011  Findings: The heart size and mediastinal contours are within normal limits.  Both lungs are clear.  The visualized skeletal structures are unremarkable.  The bowel gas pattern appears nonobstructed.  No dilated loops of small bowel or fluid levels identified.  Gas and stool identified within the colon.  IMPRESSION:  1.  Nonobstructive bowel  gas pattern. 2.  No active cardiopulmonary abnormalities.  Original Report Authenticated By: Rosealee Albee, M.D.     1. Gallstones   2. Biliary colic       MDM  Gallstones with associated biliary colic. There is no evidence of cholecystitis or obstructive symptoms based on laboratory studies. There is no indication for emergent surgical evaluation. She'll be provided pain medication, antiemetics, instructions to followup with Gen. surgery at the next available appointment. Patient asked she can be seen numerous department surgeon I explained that this is not an emergent need for surgical evaluation and that she could followup as an outpatient        Dayton Bailiff, MD 10/03/11 [phone]

## 2011-10-03 NOTE — Discharge Instructions (Signed)
Biliary Colic  Biliary colic is a steady or irregular pain in the upper abdomen. It is usually under the right side of the rib cage. It happens when gallstones interfere with the normal flow of bile from the gallbladder. Bile is a liquid that helps to digest fats. Bile is made in the liver and stored in the gallbladder. When you eat a meal, bile passes from the gallbladder through the cystic duct and the common bile duct into the small intestine. There, it mixes with partially digested food. If a gallstone blocks either of these ducts, the normal flow of bile is blocked. The muscle cells in the bile duct contract forcefully to try to move the stone. This causes the pain of biliary colic.  SYMPTOMS   A person with biliary colic usually complains of pain in the upper abdomen. This pain can be:   In the center of the upper abdomen just below the breastbone.   In the upper-right part of the abdomen, near the gallbladder and liver.   Spread back toward the right shoulder blade.   Nausea and vomiting.   The pain usually occurs after eating.   Biliary colic is usually triggered by the digestive system's demand for bile. The demand for bile is high after fatty meals. Symptoms can also occur when a person who has been fasting suddenly eats a very large meal. Most episodes of biliary colic pass after 1 to 5 hours. After the most intense pain passes, your abdomen may continue to ache mildly for about 24 hours.  DIAGNOSIS  After you describe your symptoms, your caregiver will perform a physical exam. He or she will pay attention to the upper right portion of your belly (abdomen). This is the area of your liver and gallbladder. An ultrasound will help your caregiver look for gallstones. Specialized scans of the gallbladder may also be done. Blood tests may be done, especially if you have fever or if your pain persists. PREVENTION  Biliary colic can be prevented by controlling the risk factors for gallstones.  Some of these risk factors, such as heredity, increasing age, and pregnancy are a normal part of life. Obesity and a high-fat diet are risk factors you can change through a healthy lifestyle. Women going through menopause who take hormone replacement therapy (estrogen) are also more likely to develop biliary colic. TREATMENT   Pain medication may be prescribed.   You may be encouraged to eat a fat-free diet.   If the first episode of biliary colic is severe, or episodes of colic keep retuning, surgery to remove the gallbladder (cholecystectomy) is usually recommended. This procedure can be done through small incisions using an instrument called a laparoscope. The procedure often requires a brief stay in the hospital. Some people can leave the hospital the same day. It is the most widely used treatment in people troubled by painful gallstones. It is effective and safe, with no complications in more than 90% of cases.   If surgery cannot be done, medication that dissolves gallstones may be used. This medication is expensive and can take months or years to work. Only small stones will dissolve.   Rarely, medication to dissolve gallstones is combined with a procedure called shock-wave lithotripsy. This procedure uses carefully aimed shock waves to break up gallstones. In many people treated with this procedure, gallstones form again within a few years.  PROGNOSIS  If gallstones block your cystic duct or common bile duct, you are at risk for repeated episodes of   biliary colic. There is also a 25% chance that you will develop a gallbladder infection(acute cholecystitis), or some other complication of gallstones within 10 to 20 years. If you have surgery, schedule it at a time that is convenient for you and at a time when you are not sick. HOME CARE INSTRUCTIONS   Drink plenty of clear fluids.   Avoid fatty, greasy or fried foods, or any foods that make your pain worse.   Take medications as directed.    SEEK MEDICAL CARE IF:   You develop a fever over 100.5 F (38.1 C).   Your pain gets worse over time.   You develop nausea that prevents you from eating and drinking.   You develop vomiting.  SEEK IMMEDIATE MEDICAL CARE IF:   You have continuous or severe belly (abdominal) pain which is not relieved with medications.   You develop nausea and vomiting which is not relieved with medications.   You have symptoms of biliary colic and you suddenly develop a fever and shaking chills. This may signal cholecystitis. Call your caregiver immediately.   You develop a yellow color to your skin or the white part of your eyes (jaundice).  Document Released: 12/01/2005 Document Revised: 06/19/2011 Document Reviewed: 02/10/2008 Exit[employer] Patient Information 2012 [employer].  Cholelithiasis Cholelithiasis (also called gallstones) is a form of gallbladder disease where gallstones form in your gallbladder. The gallbladder is a non-essential organ that stores bile made in the liver, which helps digest fats. Gallstones begin as small crystals and slowly grow into stones. Gallstone pain occurs when the gallbladder spasms, and a gallstone is blocking the duct. Pain can also occur when a stone passes out of the duct.  Women are more likely to develop gallstones than men. Other factors that increase the risk of gallbladder disease are:  Having multiple pregnancies. Physicians sometimes advise removing diseased gallbladders before future pregnancies.   Obesity.   Diets heavy in fried foods and fat.   Increasing age (older than 60).   Prolonged use of medications containing female hormones.   Diabetes mellitus.   Rapid weight loss.   Family history of gallstones (heredity).  SYMPTOMS  Feeling sick to your stomach (nauseous).   Abdominal pain.   Yellowing of the skin (jaundice).   Sudden pain. It may persist from several minutes to several hours.   Worsening pain with deep breathing or  when jarred.   Fever.   Tenderness to the touch.  In some cases, when gallstones do not move into the bile duct, people have no pain or symptoms. These are called "silent" gallstones. TREATMENT In severe cases, emergency surgery may be required. HOME CARE INSTRUCTIONS   Only take over-the-counter or prescription medicines for pain, discomfort, or fever as directed by your caregiver.   Follow a low-fat diet until seen again. Fat causes the gallbladder to contract, which can result in pain.   Follow up as instructed. Attacks are almost always recurrent and surgery is usually required for permanent treatment.  SEEK IMMEDIATE MEDICAL CARE IF:   Your pain increases and is not controlled by medications.   You have an oral temperature above 102 F (38.9 C), not controlled by medication.   You develop nausea and vomiting.  MAKE SURE YOU:   Understand these instructions.   Will watch your condition.   Will get help right away if you are not doing well or get worse.  Document Released: 06/26/2005 Document Revised: 06/19/2011 Document Reviewed: 08/29/2010 Exit[employer] Patient Information 2012 [employer]. 

## 2011-10-06 ENCOUNTER — Ambulatory Visit (INDEPENDENT_AMBULATORY_CARE_PROVIDER_SITE_OTHER): Payer: Managed Care, Other (non HMO) | Admitting: Surgery

## 2011-10-06 ENCOUNTER — Encounter (HOSPITAL_COMMUNITY): Payer: Self-pay | Admitting: *Deleted

## 2011-10-06 ENCOUNTER — Encounter (INDEPENDENT_AMBULATORY_CARE_PROVIDER_SITE_OTHER): Payer: Self-pay | Admitting: Surgery

## 2011-10-06 VITALS — BP 116/74 | HR 80 | Ht 63.0 in | Wt 141.8 lb

## 2011-10-06 DIAGNOSIS — K8020 Calculus of gallbladder without cholecystitis without obstruction: Secondary | ICD-10-CM

## 2011-10-06 HISTORY — PX: OTHER SURGICAL HISTORY: SHX169

## 2011-10-06 HISTORY — PX: ABDOMINOPLASTY: SUR9

## 2011-10-06 NOTE — Patient Instructions (Signed)
Laparoscopic Cholecystectomy Laparoscopic cholecystectomy is surgery to remove the gallbladder. The gallbladder is located slightly to the right of center in the abdomen, behind the liver. It is a concentrating and storage sac for the bile produced in the liver. Bile aids in the digestion and absorption of fats. Gallbladder disease (cholecystitis) is an inflammation of your gallbladder. This condition is usually caused by a buildup of gallstones (cholelithiasis) in your gallbladder. Gallstones can block the flow of bile, resulting in inflammation and pain. In severe cases, emergency surgery may be required. When emergency surgery is not required, you will have time to prepare for the procedure. Laparoscopic surgery is an alternative to open surgery. Laparoscopic surgery usually has a shorter recovery time. Your common bile duct may also need to be examined and explored. Your caregiver will discuss this with you if he or she feels this should be done. If stones are found in the common bile duct, they may be removed. LET YOUR CAREGIVER KNOW ABOUT:  Allergies to food or medicine.   Medicines taken, including vitamins, herbs, eyedrops, over-the-counter medicines, and creams.   Use of steroids (by mouth or creams).   Previous problems with anesthetics or numbing medicines.   History of bleeding problems or blood clots.   Previous surgery.   Other health problems, including diabetes and kidney problems.   Possibility of pregnancy, if this applies.  RISKS AND COMPLICATIONS All surgery is associated with risks. Some problems that may occur following this procedure include:  Infection.   Damage to the common bile duct, nerves, arteries, veins, or other internal organs such as the stomach or intestines.   Bleeding.   A stone may remain in the common bile duct.  BEFORE THE PROCEDURE  Do not take aspirin for 3 days prior to surgery or blood thinners for 1 week prior to surgery.   Do not eat or  drink anything after midnight the night before surgery.   Let your caregiver know if you develop a cold or other infectious problem prior to surgery.   You should be present 60 minutes before the procedure or as directed.  PROCEDURE  You will be given medicine that makes you sleep (general anesthetic). When you are asleep, your surgeon will make several small cuts (incisions) in your abdomen. One of these incisions is used to insert a small, lighted scope (laparoscope) into the abdomen. The laparoscope helps the surgeon see into your abdomen. Carbon dioxide gas will be pumped into your abdomen. The gas allows more room for the surgeon to perform your surgery. Other operating instruments are inserted through the other incisions. Laparoscopic procedures may not be appropriate when:  There is major scarring from previous surgery.   The gallbladder is extremely inflamed.   There are bleeding disorders or unexpected cirrhosis of the liver.   A pregnancy is near term.   Other conditions make the laparoscopic procedure impossible.  If your surgeon feels it is not safe to continue with a laparoscopic procedure, he or she will perform an open abdominal procedure. In this case, the surgeon will make an incision to open the abdomen. This gives the surgeon a larger view and field to work within. This may allow the surgeon to perform procedures that sometimes cannot be performed with a laparoscope alone. Open surgery has a longer recovery time. AFTER THE PROCEDURE  You will be taken to the recovery area where a nurse will watch and check your progress.   You may be allowed to go home   the same day.   Do not resume physical activities until directed by your caregiver.   You may resume a normal diet and activities as directed.  Document Released: 06/30/2005 Document Revised: 06/19/2011 Document Reviewed: 12/13/2010 Exit[employer] Patient Information 2012 [employer].  Laparoscopic Cholecystectomy Care  After These instructions give you information on caring for yourself after your procedure. Your doctor may also give you more specific instructions. Call your doctor if you have any problems or questions after your procedure. HOME CARE  Change your bandages (dressings) as told by your doctor.   Keep the wound dry and clean. Wash the wound gently with soap and water. Pat the wound dry with a clean towel.   Do not take baths, swim, or use hot tubs for 10 days, or as told by your doctor.   Only take medicine as told by your doctor.   Eat a normal diet as told by your doctor.   Do not lift anything heavier than 25 pounds (11.5 kg), or as told by your doctor.   Do not play contact sports for 1 week, or as told by your doctor.  GET HELP RIGHT AWAY IF:   Your wound is red, puffy (swollen), or painful.   You have yellowish-white fluid (pus) coming from the wound.   You have fluid draining from the wound for more than 1 day.   You have a bad smell coming from the wound.   Your wound breaks open.   You have a rash.   You have trouble breathing.   You have chest pain.   You have a bad reaction to your medicine.   You have a fever.   You have pain in the shoulders (shoulder strap areas).   You feel dizzy or pass out (faint).   You have severe belly (abdominal) pain.   You feel sick to your stomach (nauseous) or throw up (vomit) for more than 1 day.  MAKE SURE YOU:  Understand these instructions.   Will watch your condition.   Will get help right away if you are not doing well or get worse.  Document Released: 04/08/2008 Document Revised: 06/19/2011 Document Reviewed: 12/17/2010 ExitCare Patient Information 2012 [employer]. 

## 2011-10-06 NOTE — Progress Notes (Signed)
Patient ID: [name], female   DOB: 01/21/1971, 40 y.o.   MRN: [id number]  No chief complaint on file.   HPI [name] is a 40 y.o. female.   HPI The patient presents at request of Dr.Ehinger due to abdominal pain. The pain started back in December. She had episodic epigastric pain sharp in nature and lasts a few hours and resolved. 3 weeks ago she was seen in the emergency room with severe epigastric pain nausea. She is evaluated by ultrasound which showed gallstones. Her pain resolved and she was sent. The pain is located in the epigastrium is sharp in nature and severe when it does occur and is episodic.  No past medical history on file.  Past Surgical History  Procedure Date  . Tummy    . Cesarean section   . Appendectomy   . Tubal ligation     Family History  Problem Relation Age of Onset  . Colon cancer Mother   . Lung cancer Maternal Grandmother   . Lung cancer Maternal Grandfather     Social History History  Substance Use Topics  . Smoking status: Never Smoker   . Smokeless tobacco: Not on file  . Alcohol Use: No    No Known Allergies  Current Outpatient Prescriptions  Medication Sig Dispense Refill  . ondansetron (ZOFRAN) 4 MG tablet Take 1 tablet (4 mg total) by mouth every 6 (six) hours.  12 tablet  0  . oxyCODONE-acetaminophen (PERCOCET) 5-325 MG per tablet Take 1-2 tablets by mouth every 6 (six) hours as needed for pain.  20 tablet  0    Review of Systems Review of Systems  Constitutional: Negative for fever, chills and unexpected weight change.  HENT: Negative for hearing loss, congestion, sore throat, trouble swallowing and voice change.   Eyes: Negative for visual disturbance.  Respiratory: Negative for cough and wheezing.   Cardiovascular: Negative for chest pain, palpitations and leg swelling.  Gastrointestinal: Negative for nausea, vomiting, abdominal pain, diarrhea, constipation, blood in stool, abdominal distention and anal  bleeding.  Genitourinary: Negative for hematuria, vaginal bleeding and difficulty urinating.  Musculoskeletal: Negative for arthralgias.  Skin: Negative for rash and wound.  Neurological: Negative for seizures, syncope and headaches.  Hematological: Negative for adenopathy. Does not bruise/bleed easily.  Psychiatric/Behavioral: Negative for confusion.    Blood pressure 116/74, pulse 80, height 5' 3" (1.6 m), weight 141 lb 12.8 oz (64.32 kg), last menstrual period 09/22/2011.  Physical Exam Physical Exam  Constitutional: She is oriented to person, place, and time. She appears well-developed and well-nourished.  HENT:  Head: Normocephalic and atraumatic.  Eyes: EOM are normal. Pupils are equal, round, and reactive to light.  Neck: Normal range of motion. Neck supple.  Cardiovascular: Normal rate and regular rhythm.   Pulmonary/Chest: Effort normal and breath sounds normal.  Abdominal: Soft. Bowel sounds are normal.  Musculoskeletal: Normal range of motion.  Neurological: She is alert and oriented to person, place, and time.  Skin: Skin is warm and dry.  Psychiatric: She has a normal mood and affect. Her behavior is normal. Judgment and thought content normal.    Data Reviewed U/S multiple gallstones CBD normal   Assessment    Symptomatic cholelithiasis    Plan    Laparoscopic cholecystectomy with cholangiogram. The procedure has been discussed with the patient. Operative and non operative treatments have been discussed. Risks of surgery include bleeding, infection,  Common bile duct injury,  Injury to the stomach,liver, colon,small intestine, abdominal wall,  Diaphragm,    Major blood vessels,  And the need for an open procedure.  Other risks include worsening of medical problems, death,  DVT and pulmonary embolism, and cardiovascular events.   Medical options have also been discussed. The patient has been informed of long term expectations of surgery and non surgical options,  The  patient agrees to proceed.         Piotr Christopher A. 10/06/2011, 11:53 AM    

## 2011-10-08 ENCOUNTER — Encounter (HOSPITAL_COMMUNITY): Payer: Self-pay | Admitting: Pharmacy Technician

## 2011-10-10 ENCOUNTER — Encounter (HOSPITAL_COMMUNITY): Payer: Self-pay | Admitting: Anesthesiology

## 2011-10-10 ENCOUNTER — Encounter (HOSPITAL_COMMUNITY): Payer: Self-pay | Admitting: *Deleted

## 2011-10-10 ENCOUNTER — Observation Stay (HOSPITAL_COMMUNITY)
Admission: RE | Admit: 2011-10-10 | Discharge: 2011-10-11 | DRG: 419 | Disposition: A | Discharge status: Home / Self Care | Payer: Managed Care, Other (non HMO) | Source: Ambulatory Visit | Attending: Surgery | Admitting: Surgery

## 2011-10-10 ENCOUNTER — Inpatient Hospital Stay (HOSPITAL_COMMUNITY): Payer: Managed Care, Other (non HMO)

## 2011-10-10 ENCOUNTER — Ambulatory Visit (HOSPITAL_COMMUNITY): Payer: Managed Care, Other (non HMO) | Admitting: Anesthesiology

## 2011-10-10 ENCOUNTER — Ambulatory Visit (HOSPITAL_COMMUNITY): Payer: Managed Care, Other (non HMO)

## 2011-10-10 ENCOUNTER — Encounter (HOSPITAL_COMMUNITY)
Admission: RE | Disposition: A | Discharge status: Home / Self Care | Payer: Self-pay | Source: Ambulatory Visit | Attending: Surgery

## 2011-10-10 DIAGNOSIS — Z800 Family history of malignant neoplasm of digestive organs: Secondary | ICD-10-CM

## 2011-10-10 DIAGNOSIS — K8000 Calculus of gallbladder with acute cholecystitis without obstruction: Principal | ICD-10-CM | POA: Insufficient documentation

## 2011-10-10 DIAGNOSIS — K8066 Calculus of gallbladder and bile duct with acute and chronic cholecystitis without obstruction: Secondary | ICD-10-CM

## 2011-10-10 DIAGNOSIS — K8010 Calculus of gallbladder with chronic cholecystitis without obstruction: Secondary | ICD-10-CM | POA: Insufficient documentation

## 2011-10-10 HISTORY — PX: CHOLECYSTECTOMY: SHX55

## 2011-10-10 LAB — CBC
MCH: 31.3 pg (ref 26.0–34.0)
MCHC: 34 g/dL (ref 30.0–36.0)
MCV: 92 fL (ref 78.0–100.0)
Platelets: 239 10*3/uL (ref 150–400)
RBC: 3.74 MIL/uL — ABNORMAL LOW (ref 3.87–5.11)

## 2011-10-10 LAB — DIFFERENTIAL
Basophils Relative: 1 % (ref 0–1)
Eosinophils Absolute: 0.1 10*3/uL (ref 0.0–0.7)
Eosinophils Relative: 2 % (ref 0–5)
Lymphs Abs: 1.6 10*3/uL (ref 0.7–4.0)
Monocytes Relative: 7 % (ref 3–12)

## 2011-10-10 LAB — SURGICAL PCR SCREEN: MRSA, PCR: NEGATIVE

## 2011-10-10 SURGERY — LAPAROSCOPIC CHOLECYSTECTOMY WITH INTRAOPERATIVE CHOLANGIOGRAM
Anesthesia: General | Site: Abdomen | Wound class: Clean Contaminated

## 2011-10-10 MED ORDER — FENTANYL CITRATE 0.05 MG/ML IJ SOLN
INTRAMUSCULAR | Status: DC | PRN
  Administered 2011-10-10 (×2): 50 ug via INTRAVENOUS

## 2011-10-10 MED ORDER — KETAMINE HCL 10 MG/ML IJ SOLN
INTRAMUSCULAR | Status: DC | PRN
  Administered 2011-10-10 (×2): 5 mg via INTRAVENOUS

## 2011-10-10 MED ORDER — MIDAZOLAM HCL 5 MG/5ML IJ SOLN
INTRAMUSCULAR | Status: DC | PRN
  Administered 2011-10-10: 1 mg via INTRAVENOUS

## 2011-10-10 MED ORDER — ONDANSETRON HCL 4 MG/2ML IJ SOLN
INTRAMUSCULAR | Status: DC | PRN
  Administered 2011-10-10 (×2): 2 mg via INTRAVENOUS

## 2011-10-10 MED ORDER — CEFAZOLIN SODIUM-DEXTROSE 2-3 GM-% IV SOLR
2.0000 g | INTRAVENOUS | Status: AC
  Administered 2011-10-10: 2 g via INTRAVENOUS

## 2011-10-10 MED ORDER — BUPIVACAINE-EPINEPHRINE PF 0.25-1:200000 % IJ SOLN
INTRAMUSCULAR | Status: AC
  Filled 2011-10-10: qty 30

## 2011-10-10 MED ORDER — IOHEXOL 300 MG/ML  SOLN
INTRAMUSCULAR | Status: AC
  Filled 2011-10-10: qty 1

## 2011-10-10 MED ORDER — NEOSTIGMINE METHYLSULFATE 1 MG/ML IJ SOLN
INTRAMUSCULAR | Status: DC | PRN
  Administered 2011-10-10: 3 mg via INTRAVENOUS

## 2011-10-10 MED ORDER — KETOROLAC TROMETHAMINE 30 MG/ML IJ SOLN
INTRAMUSCULAR | Status: AC
  Administered 2011-10-10: 30 mg via INTRAVENOUS
  Filled 2011-10-10: qty 1

## 2011-10-10 MED ORDER — PROPOFOL 10 MG/ML IV EMUL
INTRAVENOUS | Status: DC | PRN
  Administered 2011-10-10: 150 mg via INTRAVENOUS

## 2011-10-10 MED ORDER — HYDROMORPHONE HCL PF 1 MG/ML IJ SOLN: 1.0000 mg | INTRAMUSCULAR | Status: DC | PRN

## 2011-10-10 MED ORDER — KCL IN DEXTROSE-NACL 10-5-0.45 MEQ/L-%-% IV SOLN
INTRAVENOUS | Status: DC
  Administered 2011-10-10 – 2011-10-11 (×2): via INTRAVENOUS
  Filled 2011-10-10 (×6): qty 1000

## 2011-10-10 MED ORDER — SUCCINYLCHOLINE CHLORIDE 20 MG/ML IJ SOLN
INTRAMUSCULAR | Status: DC | PRN
  Administered 2011-10-10: 100 mg via INTRAVENOUS

## 2011-10-10 MED ORDER — CHLORHEXIDINE GLUCONATE 4 % EX LIQD
1.0000 "application " | Freq: Once | CUTANEOUS | Status: DC
  Filled 2011-10-10: qty 15

## 2011-10-10 MED ORDER — BUPIVACAINE-EPINEPHRINE 0.25% -1:200000 IJ SOLN
INTRAMUSCULAR | Status: DC | PRN
  Administered 2011-10-10: 15 mL

## 2011-10-10 MED ORDER — CISATRACURIUM BESYLATE 2 MG/ML IV SOLN
INTRAVENOUS | Status: DC | PRN
  Administered 2011-10-10 (×2): 2 mg via INTRAVENOUS
  Administered 2011-10-10: 6 mg via INTRAVENOUS

## 2011-10-10 MED ORDER — HYDROMORPHONE HCL PF 1 MG/ML IJ SOLN
INTRAMUSCULAR | Status: DC | PRN
  Administered 2011-10-10 (×4): 0.5 mg via INTRAVENOUS

## 2011-10-10 MED ORDER — OXYCODONE-ACETAMINOPHEN 5-325 MG PO TABS
1.0000 | ORAL_TABLET | Freq: Four times a day (QID) | ORAL | Status: DC | PRN
  Administered 2011-10-11: 1 via ORAL
  Filled 2011-10-10: qty 1

## 2011-10-10 MED ORDER — DROPERIDOL 2.5 MG/ML IJ SOLN
INTRAMUSCULAR | Status: DC | PRN
  Administered 2011-10-10: .5 mg via INTRAVENOUS

## 2011-10-10 MED ORDER — KETOROLAC TROMETHAMINE 30 MG/ML IJ SOLN
30.0000 mg | Freq: Four times a day (QID) | INTRAMUSCULAR | Status: DC
  Administered 2011-10-10 – 2011-10-11 (×5): 30 mg via INTRAVENOUS
  Filled 2011-10-10 (×8): qty 1

## 2011-10-10 MED ORDER — ENOXAPARIN SODIUM 40 MG/0.4ML ~~LOC~~ SOLN
40.0000 mg | SUBCUTANEOUS | Status: DC
  Administered 2011-10-11: 40 mg via SUBCUTANEOUS
  Filled 2011-10-10: qty 0.4

## 2011-10-10 MED ORDER — DEXAMETHASONE SODIUM PHOSPHATE 4 MG/ML IJ SOLN
INTRAMUSCULAR | Status: DC | PRN
  Administered 2011-10-10: 8 mg via INTRAVENOUS

## 2011-10-10 MED ORDER — GLYCOPYRROLATE 0.2 MG/ML IJ SOLN
INTRAMUSCULAR | Status: DC | PRN
  Administered 2011-10-10: .4 mg via INTRAVENOUS

## 2011-10-10 MED ORDER — CEFAZOLIN SODIUM 1-5 GM-% IV SOLN
INTRAVENOUS | Status: AC
  Filled 2011-10-10: qty 100

## 2011-10-10 MED ORDER — LACTATED RINGERS IV SOLN
INTRAVENOUS | Status: DC | PRN
  Administered 2011-10-10: 2000 mL via INTRAVENOUS

## 2011-10-10 MED ORDER — OXYCODONE-ACETAMINOPHEN 5-325 MG PO TABS: 1.0000 | ORAL_TABLET | ORAL | Status: DC | PRN

## 2011-10-10 MED ORDER — HYDROMORPHONE HCL PF 1 MG/ML IJ SOLN: 0.2500 mg | INTRAMUSCULAR | Status: DC | PRN

## 2011-10-10 MED ORDER — LACTATED RINGERS IV SOLN
INTRAVENOUS | Status: DC | PRN
  Administered 2011-10-10 (×2): via INTRAVENOUS

## 2011-10-10 MED ORDER — LIDOCAINE HCL (CARDIAC) 20 MG/ML IV SOLN
INTRAVENOUS | Status: DC | PRN
  Administered 2011-10-10: 30 mg via INTRAVENOUS

## 2011-10-10 MED ORDER — ACETAMINOPHEN 10 MG/ML IV SOLN
INTRAVENOUS | Status: AC
  Filled 2011-10-10: qty 100

## 2011-10-10 MED ORDER — ACETAMINOPHEN 10 MG/ML IV SOLN
INTRAVENOUS | Status: DC | PRN
  Administered 2011-10-10: 1000 mg via INTRAVENOUS

## 2011-10-10 MED ORDER — IOHEXOL 300 MG/ML  SOLN
INTRAMUSCULAR | Status: DC | PRN
  Administered 2011-10-10: 25 mL

## 2011-10-10 MED ORDER — IBUPROFEN 600 MG PO TABS
600.0000 mg | ORAL_TABLET | Freq: Four times a day (QID) | ORAL | Status: DC | PRN
  Filled 2011-10-10: qty 1

## 2011-10-10 SURGICAL SUPPLY — 45 items
ADH SKN CLS APL DERMABOND .7 (GAUZE/BANDAGES/DRESSINGS)
APPLIER CLIP ROT 10 11.4 M/L (STAPLE) ×2
APR CLP MED LRG 11.4X10 (STAPLE) ×1
BAG SPEC RTRVL LRG 6X4 10 (ENDOMECHANICALS)
CANISTER SUCTION 2500CC (MISCELLANEOUS) ×2 IMPLANT
CATH REDDICK CHOLANGI 4FR 50CM (CATHETERS) ×1 IMPLANT
CHLORAPREP W/TINT 26ML (MISCELLANEOUS) ×2 IMPLANT
CLIP APPLIE ROT 10 11.4 M/L (STAPLE) ×1 IMPLANT
CLOTH BEACON ORANGE TIMEOUT ST (SAFETY) ×2 IMPLANT
COVER MAYO STAND STRL (DRAPES) ×2 IMPLANT
DECANTER SPIKE VIAL GLASS SM (MISCELLANEOUS) ×1 IMPLANT
DERMABOND ADVANCED (GAUZE/BANDAGES/DRESSINGS)
DERMABOND ADVANCED .7 DNX12 (GAUZE/BANDAGES/DRESSINGS) IMPLANT
DRAPE C-ARM 42X72 X-RAY (DRAPES) ×2 IMPLANT
DRAPE LAPAROSCOPIC ABDOMINAL (DRAPES) ×2 IMPLANT
DRAPE WARM FLUID 44X44 (DRAPE) ×2 IMPLANT
ELECT REM PT RETURN 9FT ADLT (ELECTROSURGICAL) ×2
ELECTRODE REM PT RTRN 9FT ADLT (ELECTROSURGICAL) ×1 IMPLANT
ENDOLOOP SUT PDS II  0 18 (SUTURE) ×1
ENDOLOOP SUT PDS II 0 18 (SUTURE) IMPLANT
EVACUATOR SILICONE 100CC (DRAIN) ×1 IMPLANT
FILTER SMOKE EVAC LAPAROSHD (FILTER) ×2 IMPLANT
GLOVE BIOGEL PI IND STRL 7.0 (GLOVE) ×1 IMPLANT
GLOVE BIOGEL PI INDICATOR 7.0 (GLOVE) ×1
GLOVE INDICATOR 8.0 STRL GRN (GLOVE) ×4 IMPLANT
GLOVE SS BIOGEL STRL SZ 8 (GLOVE) ×1 IMPLANT
GLOVE SUPERSENSE BIOGEL SZ 8 (GLOVE) ×1
GOWN STRL NON-REIN LRG LVL3 (GOWN DISPOSABLE) ×2 IMPLANT
GOWN STRL REIN XL XLG (GOWN DISPOSABLE) ×4 IMPLANT
HEMOSTAT SNOW SURGICEL 2X4 (HEMOSTASIS) ×1 IMPLANT
HEMOSTAT SURGICEL 4X8 (HEMOSTASIS) IMPLANT
IV CATH 14GX2 1/4 (CATHETERS) ×1 IMPLANT
KIT BASIN OR (CUSTOM PROCEDURE TRAY) ×2 IMPLANT
POUCH SPECIMEN RETRIEVAL 10MM (ENDOMECHANICALS) IMPLANT
SCISSORS LAP 5X35 DISP (ENDOMECHANICALS) ×1 IMPLANT
SET CHOLANGIOGRAPH MIX (MISCELLANEOUS) ×2 IMPLANT
SET IRRIG TUBING LAPAROSCOPIC (IRRIGATION / IRRIGATOR) ×2 IMPLANT
SUT ETHILON 2 0 PS N (SUTURE) ×1 IMPLANT
SUT MNCRL AB 4-0 PS2 18 (SUTURE) ×3 IMPLANT
TOWEL OR 17X26 10 PK STRL BLUE (TOWEL DISPOSABLE) ×2 IMPLANT
TRAY LAP CHOLE (CUSTOM PROCEDURE TRAY) ×2 IMPLANT
TROCAR BLADELESS OPT 5 75 (ENDOMECHANICALS) ×4 IMPLANT
TROCAR XCEL BLUNT TIP 100MML (ENDOMECHANICALS) ×2 IMPLANT
TROCAR XCEL NON-BLD 11X100MML (ENDOMECHANICALS) ×2 IMPLANT
TUBING INSUFFLATION 10FT LAP (TUBING) ×2 IMPLANT

## 2011-10-10 NOTE — H&P (View-Only) (Signed)
Patient ID: [name], female   DOB: 1970/10/29, 41 y.o.   MRN: [id number]  No chief complaint on file.   HPI [name] is a 41 y.o. female.   HPI The patient presents at request of Dr.Ehinger due to abdominal pain. The pain started back in December. She had episodic epigastric pain sharp in nature and lasts a few hours and resolved. 3 weeks ago she was seen in the emergency room with severe epigastric pain nausea. She is evaluated by ultrasound which showed gallstones. Her pain resolved and she was sent. The pain is located in the epigastrium is sharp in nature and severe when it does occur and is episodic.  No past medical history on file.  Past Surgical History  Procedure Date  . Tummy    . Cesarean section   . Appendectomy   . Tubal ligation     Family History  Problem Relation Age of Onset  . Colon cancer Mother   . Lung cancer Maternal Grandmother   . Lung cancer Maternal Grandfather     Social History History  Substance Use Topics  . Smoking status: Never Smoker   . Smokeless tobacco: Not on file  . Alcohol Use: No    No Known Allergies  Current Outpatient Prescriptions  Medication Sig Dispense Refill  . ondansetron (ZOFRAN) 4 MG tablet Take 1 tablet (4 mg total) by mouth every 6 (six) hours.  12 tablet  0  . oxyCODONE-acetaminophen (PERCOCET) 5-325 MG per tablet Take 1-2 tablets by mouth every 6 (six) hours as needed for pain.  20 tablet  0    Review of Systems Review of Systems  Constitutional: Negative for fever, chills and unexpected weight change.  HENT: Negative for hearing loss, congestion, sore throat, trouble swallowing and voice change.   Eyes: Negative for visual disturbance.  Respiratory: Negative for cough and wheezing.   Cardiovascular: Negative for chest pain, palpitations and leg swelling.  Gastrointestinal: Negative for nausea, vomiting, abdominal pain, diarrhea, constipation, blood in stool, abdominal distention and anal  bleeding.  Genitourinary: Negative for hematuria, vaginal bleeding and difficulty urinating.  Musculoskeletal: Negative for arthralgias.  Skin: Negative for rash and wound.  Neurological: Negative for seizures, syncope and headaches.  Hematological: Negative for adenopathy. Does not bruise/bleed easily.  Psychiatric/Behavioral: Negative for confusion.    Blood pressure 116/74, pulse 80, height 5\' 3"  (1.6 m), weight 141 lb 12.8 oz (64.32 kg), last menstrual period 09/22/2011.  Physical Exam Physical Exam  Constitutional: She is oriented to person, place, and time. She appears well-developed and well-nourished.  HENT:  Head: Normocephalic and atraumatic.  Eyes: EOM are normal. Pupils are equal, round, and reactive to light.  Neck: Normal range of motion. Neck supple.  Cardiovascular: Normal rate and regular rhythm.   Pulmonary/Chest: Effort normal and breath sounds normal.  Abdominal: Soft. Bowel sounds are normal.  Musculoskeletal: Normal range of motion.  Neurological: She is alert and oriented to person, place, and time.  Skin: Skin is warm and dry.  Psychiatric: She has a normal mood and affect. Her behavior is normal. Judgment and thought content normal.    Data Reviewed U/S multiple gallstones CBD normal   Assessment    Symptomatic cholelithiasis    Plan    Laparoscopic cholecystectomy with cholangiogram. The procedure has been discussed with the patient. Operative and non operative treatments have been discussed. Risks of surgery include bleeding, infection,  Common bile duct injury,  Injury to the stomach,liver, colon,small intestine, abdominal wall,  Diaphragm,  Major blood vessels,  And the need for an open procedure.  Other risks include worsening of medical problems, death,  DVT and pulmonary embolism, and cardiovascular events.   Medical options have also been discussed. The patient has been informed of long term expectations of surgery and non surgical options,  The  patient agrees to proceed.         Omer Monter A. 10/06/2011, 11:53 AM

## 2011-10-10 NOTE — Op Note (Signed)
Preoperative diagnosis: Chronic cholecystitis with gallstones  Postop diagnosis: Acute on chronic cholecystitis with gallstones and impacted gallstone distal cystic duct  Procedure: Laparoscopic cholecystectomy with intraoperative cholangiogram  Surgeon: Harriette Bouillon M.D.  Anesthesia: Gen. endotracheal anesthesia with 0.25% local anesthesia with bupivacaine and epinephrine  EBL: 100 cc  Specimen: Gallbladder pathology  Drains: 19 round drain to gallbladder fossa  IV fluids: 2000 cc crystalloid  Indications for procedure: The patient presents with a history of right upper quadrant pain. This is been waxing and waning. She had a severe episode about 3 weeks ago and was seen in the emergency room. Ultrasound showed multiple gallstones. She got better after that episode but still has some mild chronic right upper quadrant pain. I discussed treatment options to include laparoscopic cholecystectomy and discussed medical management as well. She was to proceed with laparoscopic cholecystectomy and cholangiogram.The procedure has been discussed with the patient. Operative and non operative treatments have been discussed. Risks of surgery include bleeding, infection,  Common bile duct injury,  Injury to the stomach,liver, colon,small intestine, abdominal wall,  Diaphragm,  Major blood vessels,  And the need for an open procedure.  Other risks include worsening of medical problems, death,  DVT and pulmonary embolism, and cardiovascular events.   Medical options have also been discussed. The patient has been informed of long term expectations of surgery and non surgical options,  The patient agrees to proceed.    Description of procedure: The patient was seen in the holding area and questions were answered. She was taken back to the operating room and placed supine on the operating room table. After induction of general anesthesia, the abdomen was prepped and draped in a sterile fashion. Timeout was done  and she received 2 g of Ancef. Curvilinear incision was made along the inferior aspect of the umbilicus after injecting with local anesthesia. Dissection was carried down the fascia and the fascia was opened in the midline and the abdominal cavity is entered. Pursestring suture of 0 Vicryl was placed. 12 mm Hassan cannula was placed under direct vision. Pneumoperitoneum was created to 15 mm mercury of CO2. Laparoscopy was done and showed no significant abnormality except for acutely inflamed gallbladder. This appeared to be acute on chronic inflammation. 11 mm subxiphoid port was placed. 2   5 mm ports are placed the right upper quadrant under direct vision. The gallbladder was tense and chronically inflamed with acute component. I decompressed the gallbladder. We then grabbed the dome and retracted toward the patient's right shoulder. The infundibulum was thickened and the cystic duct was dilated. A window was created around the cystic duct carefully. The common duct was easily visualized and appeared mildly dilated. The junction of the cystic duct and common duct was visualized. The cystic artery was divided between clips to achieve the critical angle. Clips are placed the gallbladder side of the cystic duct a small incision was made. Through separate stab incision a Cook cholangiogram catheter was introduced and placed in the cystic duct. Cholangiogram was performed there is a stone occluding the distal cystic duct. I removed the catheter and dissected down to the base of the cystic duct carefully making sure not to injure the common bile duct. I was able to milk a stone out of the cystic duct. I used a Riddick cholangiogram catheter and tried to repeat the cholangiogram. Again a stone was occluding the distal cystic duct I could not visualize the common duct. I could not remove the stent since was impacted  in the cystic duct. I elected to go ahead with a catheter and ligate the cystic duct stump with an  Endoloop. I felt that postoperative MRI or possibly ERCP would be useful. There is no leak of bile from the stump. The posterior branch of the cystic artery was clipped and divided. The gallbladder was very edematous and thickened and was excised the gallbladder bed. The bed was made hemostatic with cautery and Surgicel. 19 round drain was placed the gallbladder fossa and brought out the most lateral port site. The gallbladder was placed in an Endo Catch bag. The gallbladder was removed through the umbilicus. The fascia was closed with a Pershing suture of 0 Vicryl. Excess irrigation was suctioned out. Quadrant laparoscopy revealed no evidence of visceral injury. Ports were removed the CO2 was allowed to escape. The drain was placed to bulb suction. The skin was closed with 4-0 Monocryl and Dermabond. All final counts sponge, needles and stricture found to be correct. The patient was awoke taken to recovery in satisfactory condition.

## 2011-10-10 NOTE — Anesthesia Preprocedure Evaluation (Signed)
Anesthesia Evaluation  Patient identified by MRN, date of birth, ID band Patient awake    Reviewed: Allergy & Precautions, H&P , NPO status , Patient's Chart, lab work & pertinent test results, reviewed documented beta blocker date and time   Airway Mallampati: II  Neck ROM: Full    Dental  (+) Teeth Intact and Dental Advisory Given   Pulmonary neg pulmonary ROS,  breath sounds clear to auscultation        Cardiovascular negative cardio ROS  Rhythm:Regular Rate:Normal  Denies cardiac symptoms   Neuro/Psych negative neurological ROS  negative psych ROS   GI/Hepatic Neg liver ROS, gallstones   Endo/Other  negative endocrine ROS  Renal/GU negative Renal ROS  negative genitourinary   Musculoskeletal negative musculoskeletal ROS (+)   Abdominal   Peds negative pediatric ROS (+)  Hematology negative hematology ROS (+)   Anesthesia Other Findings   Reproductive/Obstetrics negative OB ROS                           Anesthesia Physical Anesthesia Plan  ASA: I  Anesthesia Plan: General   Post-op Pain Management:    Induction: Intravenous  Airway Management Planned: Oral ETT  Additional Equipment:   Intra-op Plan:   Post-operative Plan: Extubation in OR  Informed Consent: I have reviewed the patients History and Physical, chart, labs and discussed the procedure including the risks, benefits and alternatives for the proposed anesthesia with the patient or authorized representative who has indicated his/her understanding and acceptance.   Dental advisory given  Plan Discussed with: CRNA and Surgeon  Anesthesia Plan Comments:         Anesthesia Quick Evaluation

## 2011-10-10 NOTE — Interval H&P Note (Signed)
History and Physical Interval Note:  10/10/2011 7:18 AM  [name]  has presented today for surgery, with the diagnosis of GALLSTONES  The various methods of treatment have been discussed with the patient and family. After consideration of risks, benefits and other options for treatment, the patient has consented to  Procedure(s) (LRB): LAPAROSCOPIC CHOLECYSTECTOMY WITH INTRAOPERATIVE CHOLANGIOGRAM (N/A) as a surgical intervention .  The patients' history has been reviewed, patient examined, no change in status, stable for surgery.  I have reviewed the patients' chart and labs.  Questions were answered to the patient's satisfaction.     [name]

## 2011-10-10 NOTE — Transfer of Care (Signed)
Immediate Anesthesia Transfer of Care Note  Patient: [name]  Procedure(s) Performed: Procedure(s) (LRB): LAPAROSCOPIC CHOLECYSTECTOMY WITH INTRAOPERATIVE CHOLANGIOGRAM (N/A)  Patient Location: PACU  Anesthesia Type: General  Level of Consciousness: awake and alert   Airway & Oxygen Therapy: Patient Spontanous Breathing and Patient connected to face mask oxygen  Post-op Assessment: Report given to PACU RN, Post -op Vital signs reviewed and stable and Patient moving all extremities  Post vital signs: Reviewed and stable  Complications: No apparent anesthesia complications

## 2011-10-10 NOTE — Anesthesia Postprocedure Evaluation (Signed)
  Anesthesia Post-op Note  Patient: [name]  Procedure(s) Performed: Procedure(s) (LRB): LAPAROSCOPIC CHOLECYSTECTOMY WITH INTRAOPERATIVE CHOLANGIOGRAM (N/A)  Patient Location: PACU  Anesthesia Type: General  Level of Consciousness: oriented and sedated  Airway and Oxygen Therapy: Patient Spontanous Breathing and Patient connected to nasal cannula oxygen  Post-op Pain: mild  Post-op Assessment: Post-op Vital signs reviewed, Patient's Cardiovascular Status Stable, Respiratory Function Stable and Patent Airway  Post-op Vital Signs: stable  Complications: No apparent anesthesia complications

## 2011-10-11 LAB — CBC
MCH: 31.3 pg (ref 26.0–34.0)
MCHC: 34 g/dL (ref 30.0–36.0)
MCV: 92.2 fL (ref 78.0–100.0)
Platelets: 233 10*3/uL (ref 150–400)
RBC: 3.48 MIL/uL — ABNORMAL LOW (ref 3.87–5.11)

## 2011-10-11 LAB — COMPREHENSIVE METABOLIC PANEL
ALT: 53 U/L — ABNORMAL HIGH (ref 0–35)
AST: 67 U/L — ABNORMAL HIGH (ref 0–37)
Albumin: 3.2 g/dL — ABNORMAL LOW (ref 3.5–5.2)
Alkaline Phosphatase: 60 U/L (ref 39–117)
CO2: 28 mEq/L (ref 19–32)
Chloride: 103 mEq/L (ref 96–112)
Creatinine, Ser: 0.72 mg/dL (ref 0.50–1.10)
Potassium: 3.5 mEq/L (ref 3.5–5.1)
Sodium: 135 mEq/L (ref 135–145)
Total Bilirubin: 0.3 mg/dL (ref 0.3–1.2)

## 2011-10-11 MED ORDER — OXYCODONE-ACETAMINOPHEN 5-325 MG PO TABS: 1.0000 | ORAL_TABLET | Freq: Four times a day (QID) | ORAL | Status: DC | PRN

## 2011-10-11 MED ORDER — OXYCODONE-ACETAMINOPHEN 5-325 MG PO TABS: 1.0000 | ORAL_TABLET | Freq: Four times a day (QID) | ORAL | Status: AC | PRN

## 2011-10-11 NOTE — Discharge Summary (Signed)
Physician Discharge Summary  Patient ID: [name] MRN: [id number] DOB/AGE: [birth year] 42 y.o.  Admit date: 10/10/2011 Discharge date: 10/11/2011  Admission Diagnoses: chronic cholecystitis  Discharge Diagnoses: same Active Problems:  * No active hospital problems. *    Discharged Condition: good  Hospital Course: Underwent laparoscopic cholecystectomy with cholangiogram.  Stone impacted at cystic duct CBD junction.  MRI shows no CBD stone.  She did well post op.  Tolerated diet and had good pain control.  Consults: None  Significant Diagnostic Studies: MRCP  Treatments: surgery: LAP CHOLE  Discharge Exam: Blood pressure 113/82, pulse 66, temperature 98.5 F (36.9 C), temperature source Oral, resp. rate 18, height 5\' 3"  (1.6 m), weight 141 lb 12.8 oz (64.32 kg), last menstrual period 09/22/2011, SpO2 98.00%. Incision/Wound:CLEAN DRY INTACT NONTENDER  Disposition: 01-Home or Self Care   Medication List  As of 10/11/2011  7:20 AM   ASK your doctor about these medications         ondansetron 4 MG tablet   Commonly known as: ZOFRAN   Take 4 mg by mouth every 6 (six) hours as needed. Nausea      oxyCODONE-acetaminophen 5-325 MG per tablet   Commonly known as: PERCOCET   Take 1-2 tablets by mouth every 6 (six) hours as needed for pain.           Follow-up Information    Follow up with Cielle Aguila A., MD. Schedule an appointment as soon as possible for a visit in 3 days.   Contact information:   [facility] [address] [place] [address] [phone]          Signed: Dortha Schwalbe. 10/11/2011, 7:20 AM

## 2011-10-11 NOTE — Progress Notes (Signed)
1 Day Post-Op  Subjective: Some mild pain  Objective: Vital signs in last 24 hours: Temp:  [97.6 F (36.4 C)-99 F (37.2 C)] 98.5 F (36.9 C) (03/30 0530) Pulse Rate:  [59-91] 66  (03/30 0530) Resp:  [8-20] 18  (03/30 0530) BP: (112-144)/(67-88) 113/82 mmHg (03/30 0530) SpO2:  [98 %-100 %] 98 % (03/30 0530) Weight:  [141 lb 12.8 oz (64.32 kg)] 141 lb 12.8 oz (64.32 kg) (03/29 1138) Last BM Date: 10/09/11  Intake/Output from previous day: 03/29 0701 - 03/30 0700 In: 3253.8 [P.O.:340; I.V.:2863.8; IV Piggyback:50] Out: 2490 [Urine:2325; Drains:150; Blood:15] Intake/Output this shift:    Incision/Wound:clean dry JP serous 150 cc  No bile  Lab Results:   Basename 10/11/11 0420 10/10/11 0615  WBC 8.4 5.2  HGB 10.9* 11.7*  HCT 32.1* 34.4*  PLT 233 239   BMET  Basename 10/11/11 0420  NA 135  K 3.5  CL 103  CO2 28  GLUCOSE 114*  BUN 5*  CREATININE 0.72  CALCIUM 8.5   PT/INR No results found for this basename: LABPROT:2,INR:2 in the last 72 hours ABG No results found for this basename: PHART:2,PCO2:2,PO2:2,HCO3:2 in the last 72 hours  Studies/Results: Dg Cholangiogram Operative  10/10/2011  *RADIOLOGY REPORT*  Clinical Data:   41 year old female with gallstones.  INTRAOPERATIVE CHOLANGIOGRAM  Technique:  Cholangiographic images from the C-arm fluoroscopic device were submitted for interpretation post-operatively.  Please see the procedural report for the amount of contrast and the fluoroscopy time utilized.  Comparison:  Abdominal ultrasound 10/03/2011.  Findings:  Multiple fluoroscopic images of the right upper quadrant performed intraoperative.  Surgical clips at the level of the cystic duct.  Contrast is injected and defines a dilated cystic duct with a distal round filling defect within the duct.  Contrast has not progressed into the CBD, but instead refluxes into the gallbladder which is filled with numerous filling defects.  Further contrast injection results in some  extravasation.  The CBD is not identified.  IMPRESSION: 1.  Obstructing calculus in the distal cystic duct.  Extensive cholelithiasis. 2.  No contrast traverses the cystic duct obstruction, no CBD opacification.  Original Report Authenticated By: Harley Hallmark, M.D.   Mr Mrcp  10/10/2011  *RADIOLOGY REPORT*  Clinical Data:  Status post cholecystectomy.  The patient is noted to have a cystic duct stone at the time of operative cholangiography.  MRI ABDOMEN WITHOUT CONTRAST (MRCP)  Technique: Multiplanar multisequence MR imaging of the abdomen was performed, including heavily T2-weighted images of the biliary and pancreatic ducts.  Three-dimensional MR images were rendered by post processing of the original MR data.  Comparison:  Intraoperative cholangiogram from earlier the same day.  Findings:  There is a tiny amount of fluid in the m r a p both the around the liver.  No focal intrahepatic parenchymal abnormalities evident.  There is no intra or extrahepatic biliary duct dilatation.  The extrahepatic common duct measures 4 mm in diameter.  On image 22 of series 3, a 5 mm round structure that is signal void appears to reside within a short segment of fluid filled duct.  I suspect this is the cystic duct stone that was seen at operative cholangiography.  This is clearly not in the common bile duct at this time.  The patient has some fluid/debris in the gallbladder fossa.  A surgical drain can be seen entering from the lower right abdomen and tracking crossing anterior midline under the liver.  The pancreas, adrenal glands, and kidneys are  unremarkable.  Tiny right pleural effusion noted.  There is some compressive atelectasis in both lung bases.  IMPRESSION: No evidence for stones in the common hepatic duct or common bile duct.  There is no intra or extrahepatic biliary duct dilatation. It does appear that there is still a stone in the cystic duct, as indicated in the operative note and seen on the intraoperative  cholangiogram.  There is a tiny amount of fluid tracking along the anterior and inferior liver with a small amount of fluid/debris in the gallbladder fossa, not outside the expected findings on the day of surgery.  Surgical drain is seen tracking from the right lower abdomen up under the left liver towards the anterior midline.  I discussed the results of this study with Dr. Luisa Hart at 1643 hours on 10/10/2011.  Original Report Authenticated By: ERIC A. MANSELL, M.D.   Mr 3d Recon At Scanner  10/10/2011  *RADIOLOGY REPORT*  Clinical Data:  Status post cholecystectomy.  The patient is noted to have a cystic duct stone at the time of operative cholangiography.  MRI ABDOMEN WITHOUT CONTRAST (MRCP)  Technique: Multiplanar multisequence MR imaging of the abdomen was performed, including heavily T2-weighted images of the biliary and pancreatic ducts.  Three-dimensional MR images were rendered by post processing of the original MR data.  Comparison:  Intraoperative cholangiogram from earlier the same day.  Findings:  There is a tiny amount of fluid in the m r a p both the around the liver.  No focal intrahepatic parenchymal abnormalities evident.  There is no intra or extrahepatic biliary duct dilatation.  The extrahepatic common duct measures 4 mm in diameter.  On image 22 of series 3, a 5 mm round structure that is signal void appears to reside within a short segment of fluid filled duct.  I suspect this is the cystic duct stone that was seen at operative cholangiography.  This is clearly not in the common bile duct at this time.  The patient has some fluid/debris in the gallbladder fossa.  A surgical drain can be seen entering from the lower right abdomen and tracking crossing anterior midline under the liver.  The pancreas, adrenal glands, and kidneys are unremarkable.  Tiny right pleural effusion noted.  There is some compressive atelectasis in both lung bases.  IMPRESSION: No evidence for stones in the common  hepatic duct or common bile duct.  There is no intra or extrahepatic biliary duct dilatation. It does appear that there is still a stone in the cystic duct, as indicated in the operative note and seen on the intraoperative cholangiogram.  There is a tiny amount of fluid tracking along the anterior and inferior liver with a small amount of fluid/debris in the gallbladder fossa, not outside the expected findings on the day of surgery.  Surgical drain is seen tracking from the right lower abdomen up under the left liver towards the anterior midline.  I discussed the results of this study with Dr. Luisa Hart at 1643 hours on 10/10/2011.  Original Report Authenticated By: ERIC A. MANSELL, M.D.    Anti-infectives: Anti-infectives     Start     Dose/Rate Route Frequency Ordered Stop   10/10/11 0553   ceFAZolin (ANCEF) IVPB 2 g/50 mL premix        2 g 100 mL/hr over 30 Minutes Intravenous 60 min pre-op 10/10/11 0553 10/10/11 0755          Assessment/Plan: s/p Procedure(s) (LRB): LAPAROSCOPIC CHOLECYSTECTOMY WITH INTRAOPERATIVE CHOLANGIOGRAM (N/A) Discharge MRI  CBD clear.  Stone impacted in cystic duct.  Will observe at this point.  Keep Drain until Tuesday.  Return to office Tuesday.  Pt aware .  LOS: 1 day    Pernell Dikes A. 10/11/2011

## 2011-10-11 NOTE — Discharge Instructions (Signed)
[facility] ______[facility] LAPAROSCOPIC SURGERY: POST OP INSTRUCTIONS Always review your discharge instruction sheet given to you by the facility where your surgery was performed. IF YOU HAVE DISABILITY OR FAMILY LEAVE FORMS, YOU MUST BRING THEM TO THE OFFICE FOR PROCESSING.   DO NOT GIVE THEM TO YOUR DOCTOR.  1. A prescription for pain medication may be given to you upon discharge.  Take your pain medication as prescribed, if needed.  If narcotic pain medicine is not needed, then you may take acetaminophen (Tylenol) or ibuprofen (Advil) as needed. 2. Take your usually prescribed medications unless otherwise directed. 3. If you need a refill on your pain medication, please contact your pharmacy.  They will contact our office to request authorization. Prescriptions will not be filled after 5pm or on week-ends. 4. You should follow a light diet the first few days after arrival home, such as soup and crackers, etc.  Be sure to include lots of fluids daily. 5. Most patients will experience some swelling and bruising in the area of the incisions.  Ice packs will help.  Swelling and bruising can take several days to resolve.  6. It is common to experience some constipation if taking pain medication after surgery.  Increasing fluid intake and taking a stool softener (such as Colace) will usually help or prevent this problem from occurring.  A mild laxative (Milk of Magnesia or Miralax) should be taken according to package instructions if there are no bowel movements after 48 hours. 7. Unless discharge instructions indicate otherwise, you may remove your bandages 24-48 hours after surgery, and you may shower at that time.  You may have steri-strips (small skin tapes) in place directly over the incision.  These strips should be left on the skin for 7-10 days.  If your surgeon used skin glue on the incision, you may shower in 24 hours.  The glue will flake off over the next 2-3 weeks.  Any sutures or  staples will be removed at the office during your follow-up visit. 8. ACTIVITIES:  You may resume regular (light) daily activities beginning the next day--such as daily self-care, walking, climbing stairs--gradually increasing activities as tolerated.  You may have sexual intercourse when it is comfortable.  Refrain from any heavy lifting or straining until approved by your doctor. a. You may drive when you are no longer taking prescription pain medication, you can comfortably wear a seatbelt, and you can safely maneuver your car and apply brakes. b. RETURN TO WORK:  __________________________________________________________ 9. You should see your doctor in the office for a follow-up appointment approximately 2-3 weeks after your surgery.  Make sure that you call for this appointment within a day or two after you arrive home to insure a convenient appointment time. 10. OTHER INSTRUCTIONS: __________________________________________________________________________________________________________________________ __________________________________________________________________________________________________________________________ WHEN TO CALL YOUR DOCTOR: 1. Fever over 101.0 2. Inability to urinate 3. Continued bleeding from incision. 4. Increased pain, redness, or drainage from the incision. 5. Increasing abdominal pain  The clinic staff is available to answer your questions during regular business hours.  Please don't hesitate to call and ask to speak to one of the nurses for clinical concerns.  If you have a medical emergency, go to the nearest emergency room or call 911.  A surgeon from [facility] is always on call at the hospital. [address] [place], [place], [place]  [address] ? [address], [place], [place]   [address] [phone] ? [phone] ? FAX [phone] Web site:  [email or web address]   Empty drain daily.  Call office Monday to come in for  drain removal on Tuesday.

## 2011-10-13 ENCOUNTER — Telehealth (INDEPENDENT_AMBULATORY_CARE_PROVIDER_SITE_OTHER): Payer: Self-pay

## 2011-10-13 ENCOUNTER — Encounter (HOSPITAL_COMMUNITY): Payer: Self-pay | Admitting: Surgery

## 2011-10-13 MED FILL — Mupirocin Oint 2%: CUTANEOUS | Qty: 22 | Status: AC

## 2011-10-13 NOTE — Telephone Encounter (Signed)
Patient called to make follow up appointment per [place]. She also had concerns about left sided pain. She states she has not had a bowel movement since the 21st. I told her the pain could be from not having bowel movement that long. Told her to get Miralax or Milk of Magnesia and take as directed and to also drink a lot of water. Told her to call back if she doesn't get bowels moving.

## 2011-10-14 ENCOUNTER — Encounter (INDEPENDENT_AMBULATORY_CARE_PROVIDER_SITE_OTHER): Payer: Managed Care, Other (non HMO) | Admitting: General Surgery

## 2011-10-14 ENCOUNTER — Ambulatory Visit (INDEPENDENT_AMBULATORY_CARE_PROVIDER_SITE_OTHER): Payer: Managed Care, Other (non HMO) | Admitting: Surgery

## 2011-10-14 ENCOUNTER — Encounter (INDEPENDENT_AMBULATORY_CARE_PROVIDER_SITE_OTHER): Payer: Self-pay | Admitting: Surgery

## 2011-10-14 ENCOUNTER — Telehealth: Payer: Self-pay | Admitting: Genetic Counselor

## 2011-10-14 VITALS — BP 112/68 | HR 72 | Temp 98.8°F | Resp 12 | Ht 63.0 in | Wt 141.2 lb

## 2011-10-14 DIAGNOSIS — Z9889 Other specified postprocedural states: Secondary | ICD-10-CM

## 2011-10-14 NOTE — Telephone Encounter (Signed)
Told patient that she did not inherit the PMS2 mutation found in her mother.  PMS2 testing was negative.

## 2011-10-14 NOTE — Patient Instructions (Signed)
Call if any problems and see Dr Luisa Hart as scheduled

## 2011-10-14 NOTE — Progress Notes (Signed)
NAME: [name]       DOB: [birth year]           DATE: 10/14/2011       NWG:[id number]   CC: Postop laparoscopic cholecystectomy  HPI:  This patient underwent a laparoscopic cholecystectomy attempted operative cholangiogram on 10/10/2011. She is in for her first postoperative visit. She notes that her incisional pain has resolved. Her preoperative symptoms have improved. She is not having problems with nausea, vomiting, diarrhea, fevers, chills, or urinary symptoms. She is tolerating diet. She has intermittent pain LLQ near AS Iliac spine of uncertain etiology. Mild contstipation. Drain has been pink koolaide PE: General: The patient is alert and appears comfortable, NAD.  Abdomen: Soft and benign. The incisions are healing nicely. There are no apparent problems.  Data reviewed: IOC:  No CBD opacification Pathology:  Acute and chronic calculus cholecyctiits  Impression:  The patient appears to be doing well, with improvement in her symptoms.  Plan:  She may resume full activity and regular diet I removed her drain. She has a f/u with Dr Luisa Hart already scheduled.

## 2011-10-15 ENCOUNTER — Encounter: Payer: Self-pay | Admitting: Genetic Counselor

## 2011-10-23 ENCOUNTER — Telehealth (INDEPENDENT_AMBULATORY_CARE_PROVIDER_SITE_OTHER): Payer: Self-pay

## 2011-10-23 NOTE — Telephone Encounter (Signed)
The patient called and states she is in [place].  She had a lap chole and has a stone impacted.  She is now having right sided pain that feels like it did preop.   She is wondering if the stone has moved and how quickly she may get into trouble while out of town.  I told her I can't answer that but I will speak to Dr Jamey Ripa because he saw her last week and pulled her drain.  Dr Jamey Ripa advised it is hard to say over the phone but she may need to get seen in the Er or an urgent care where she is and they can draw labs.  If she has an elevated white count or liver functions she may need some studies done.  I told the pt.  She is in [place] with her daughter on a bus school trip.  She can have the bus driver or another parent to drive her somewhere or she could go to the airport and get home soon.  I told her whichever would get her taken care of soonest because we don't know how soon she may have more trouble.  Patient may call us if she needs her notes faxed to where she is being seen.

## 2011-10-24 ENCOUNTER — Emergency Department (HOSPITAL_COMMUNITY): Payer: Managed Care, Other (non HMO)

## 2011-10-24 ENCOUNTER — Encounter (HOSPITAL_COMMUNITY): Payer: Self-pay | Admitting: Emergency Medicine

## 2011-10-24 ENCOUNTER — Telehealth (INDEPENDENT_AMBULATORY_CARE_PROVIDER_SITE_OTHER): Payer: Self-pay | Admitting: General Surgery

## 2011-10-24 ENCOUNTER — Inpatient Hospital Stay (HOSPITAL_COMMUNITY)
Admission: EM | Admit: 2011-10-24 | Discharge: 2011-10-29 | DRG: 446 | Disposition: A | Discharge status: Home / Self Care | Payer: Managed Care, Other (non HMO) | Attending: Surgery | Admitting: Surgery

## 2011-10-24 DIAGNOSIS — K8051 Calculus of bile duct without cholangitis or cholecystitis with obstruction: Principal | ICD-10-CM | POA: Diagnosis present

## 2011-10-24 DIAGNOSIS — R7989 Other specified abnormal findings of blood chemistry: Secondary | ICD-10-CM | POA: Diagnosis present

## 2011-10-24 DIAGNOSIS — Z9089 Acquired absence of other organs: Secondary | ICD-10-CM

## 2011-10-24 DIAGNOSIS — Z800 Family history of malignant neoplasm of digestive organs: Secondary | ICD-10-CM

## 2011-10-24 DIAGNOSIS — R748 Abnormal levels of other serum enzymes: Secondary | ICD-10-CM

## 2011-10-24 DIAGNOSIS — K8050 Calculus of bile duct without cholangitis or cholecystitis without obstruction: Secondary | ICD-10-CM

## 2011-10-24 DIAGNOSIS — K831 Obstruction of bile duct: Secondary | ICD-10-CM

## 2011-10-24 HISTORY — DX: Nausea with vomiting, unspecified: Z98.890

## 2011-10-24 HISTORY — DX: Calculus of gallbladder without cholecystitis without obstruction: K80.20

## 2011-10-24 HISTORY — DX: Other specified postprocedural states: R11.2

## 2011-10-24 LAB — COMPREHENSIVE METABOLIC PANEL
AST: 1358 U/L — ABNORMAL HIGH (ref 0–37)
Albumin: 4 g/dL (ref 3.5–5.2)
Calcium: 9 mg/dL (ref 8.4–10.5)
Creatinine, Ser: 0.74 mg/dL (ref 0.50–1.10)
Sodium: 139 mEq/L (ref 135–145)

## 2011-10-24 LAB — DIFFERENTIAL
Basophils Absolute: 0 10*3/uL (ref 0.0–0.1)
Basophils Relative: 1 % (ref 0–1)
Eosinophils Relative: 1 % (ref 0–5)
Monocytes Absolute: 0.2 10*3/uL (ref 0.1–1.0)
Neutro Abs: 3.6 10*3/uL (ref 1.7–7.7)

## 2011-10-24 LAB — HEPATIC FUNCTION PANEL
ALT: 1210 U/L — ABNORMAL HIGH (ref 0–35)
Alkaline Phosphatase: 219 U/L — ABNORMAL HIGH (ref 39–117)
Bilirubin, Direct: 2.3 mg/dL — ABNORMAL HIGH (ref 0.0–0.3)
Indirect Bilirubin: 1.1 mg/dL — ABNORMAL HIGH (ref 0.3–0.9)

## 2011-10-24 LAB — CBC
HCT: 35.7 % — ABNORMAL LOW (ref 36.0–46.0)
MCHC: 33.9 g/dL (ref 30.0–36.0)
MCV: 92.2 fL (ref 78.0–100.0)
Platelets: 284 10*3/uL (ref 150–400)
RDW: 12.9 % (ref 11.5–15.5)

## 2011-10-24 LAB — POCT PREGNANCY, URINE: Preg Test, Ur: NEGATIVE

## 2011-10-24 MED ORDER — KCL IN DEXTROSE-NACL 10-5-0.45 MEQ/L-%-% IV SOLN
INTRAVENOUS | Status: DC
  Administered 2011-10-24 – 2011-10-25 (×3): via INTRAVENOUS
  Administered 2011-10-25: 1000 mL via INTRAVENOUS
  Administered 2011-10-26 – 2011-10-27 (×2): via INTRAVENOUS
  Administered 2011-10-27: 1000 mL via INTRAVENOUS
  Administered 2011-10-28 – 2011-10-29 (×3): via INTRAVENOUS
  Filled 2011-10-24 (×17): qty 1000

## 2011-10-24 MED ORDER — MORPHINE SULFATE 4 MG/ML IJ SOLN
4.0000 mg | Freq: Once | INTRAMUSCULAR | Status: AC
  Administered 2011-10-24: 4 mg via INTRAVENOUS
  Filled 2011-10-24: qty 1

## 2011-10-24 MED ORDER — HYDROMORPHONE HCL PF 1 MG/ML IJ SOLN
1.0000 mg | INTRAMUSCULAR | Status: DC | PRN
  Administered 2011-10-24 – 2011-10-28 (×13): 1 mg via INTRAVENOUS
  Filled 2011-10-24 (×6): qty 1
  Filled 2011-10-24: qty 4
  Filled 2011-10-24 (×7): qty 1

## 2011-10-24 MED ORDER — ONDANSETRON HCL 4 MG/2ML IJ SOLN
4.0000 mg | Freq: Once | INTRAMUSCULAR | Status: AC
  Administered 2011-10-24: 4 mg via INTRAVENOUS
  Filled 2011-10-24: qty 2

## 2011-10-24 MED ORDER — IOHEXOL 300 MG/ML  SOLN
80.0000 mL | Freq: Once | INTRAMUSCULAR | Status: AC | PRN
  Administered 2011-10-24: 80 mL via INTRAVENOUS

## 2011-10-24 MED ORDER — ONDANSETRON HCL 4 MG/2ML IJ SOLN
4.0000 mg | Freq: Four times a day (QID) | INTRAMUSCULAR | Status: DC | PRN
  Administered 2011-10-24 – 2011-10-25 (×4): 4 mg via INTRAVENOUS
  Filled 2011-10-24 (×4): qty 2

## 2011-10-24 MED ORDER — ACETAMINOPHEN 325 MG PO TABS
650.0000 mg | ORAL_TABLET | Freq: Four times a day (QID) | ORAL | Status: DC | PRN
  Administered 2011-10-24: 650 mg via ORAL
  Filled 2011-10-24: qty 2

## 2011-10-24 NOTE — H&P (Signed)
[name] is an 41 y.o. female.   Chief Complaint: RUQ pain  2 weeks after Lap chole HPI: Pt is 2 weeks out from Lap chole.  She had impacted cystic duct stone so cholangiogram not possible.  Post surgery MRCP showed no CBD stone or obstruction.  Felt well until 24 hours ago when she had sudden RUQ pain.  She was in [place] and flew home today.  Pain is better but LFT markedly elevated with bilirubin 3.5 and transaminases over 1000.  No excess ETOH use or tylenol use.  Past Medical History  Diagnosis Date  . Headache 10-06-11    hx. migraines occ.  Merri Ray stones   . PONV (postoperative nausea and vomiting)     Past Surgical History  Procedure Date  . Tummy  10-06-11    11-'11Tummy Tuck  . Cesarean section 10-06-11    '04/ '05  . Appendectomy   . Tubal ligation   . Cholecystectomy 10/10/2011    Procedure: LAPAROSCOPIC CHOLECYSTECTOMY WITH INTRAOPERATIVE CHOLANGIOGRAM;  Surgeon: Clovis Pu. Emmaline Wahba, MD;  Location: [facility];  Service: General;  Laterality: N/A;  Laparoscopic Cholecystectomy and Cholangiogram    Family History  Problem Relation Age of Onset  . Colon cancer Mother   . Cancer Mother     colon  . Lung cancer Maternal Grandmother   . Lung cancer Maternal Grandfather    Social History:  reports that she has never smoked. She has never used smokeless tobacco. She reports that she does not drink alcohol or use illicit drugs.  Allergies: No Known Allergies  Medications Prior to Admission  Medication Dose Route Frequency Provider Last Rate Last Dose  . morphine 4 MG/ML injection 4 mg  4 mg Intravenous Once [employer], PA   4 mg at 10/24/11 1107  . ondansetron (ZOFRAN) injection 4 mg  4 mg Intravenous Once [place], PA   4 mg at 10/24/11 1106   No current outpatient prescriptions on file as of 10/24/2011.    Results for orders placed during the hospital encounter of 10/24/11 (from the past 48 hour(s))  POCT PREGNANCY, URINE     Status: Normal   Collection Time     10/24/11 10:47 AM      Component Value Range Comment   Preg Test, Ur NEGATIVE  NEGATIVE    CBC     Status: Abnormal   Collection Time   10/24/11 10:50 AM      Component Value Range Comment   WBC 4.9  4.0 - 10.5 (K/uL)    RBC 3.87  3.87 - 5.11 (MIL/uL)    Hemoglobin 12.1  12.0 - 15.0 (g/dL)    HCT 14.7 (*) 82.9 - 46.0 (%)    MCV 92.2  78.0 - 100.0 (fL)    MCH 31.3  26.0 - 34.0 (pg)    MCHC 33.9  30.0 - 36.0 (g/dL)    RDW 56.2  13.0 - 86.5 (%)    Platelets 284  150 - 400 (K/uL)   DIFFERENTIAL     Status: Normal   Collection Time   10/24/11 10:50 AM      Component Value Range Comment   Neutrophils Relative 73  43 - 77 (%)    Neutro Abs 3.6  1.7 - 7.7 (K/uL)    Lymphocytes Relative 21  12 - 46 (%)    Lymphs Abs 1.1  0.7 - 4.0 (K/uL)    Monocytes Relative 4  3 - 12 (%)  Monocytes Absolute 0.2  0.1 - 1.0 (K/uL)    Eosinophils Relative 1  0 - 5 (%)    Eosinophils Absolute 0.0  0.0 - 0.7 (K/uL)    Basophils Relative 1  0 - 1 (%)    Basophils Absolute 0.0  0.0 - 0.1 (K/uL)   COMPREHENSIVE METABOLIC PANEL     Status: Abnormal   Collection Time   10/24/11 10:50 AM      Component Value Range Comment   Sodium 139  135 - 145 (mEq/L)    Potassium 4.1  3.5 - 5.1 (mEq/L)    Chloride 104  96 - 112 (mEq/L)    CO2 25  19 - 32 (mEq/L)    Glucose, Bld 85  70 - 99 (mg/dL)    BUN 9  6 - 23 (mg/dL)    Creatinine, Ser 6.96  0.50 - 1.10 (mg/dL)    Calcium 9.0  8.4 - 10.5 (mg/dL)    Total Protein 7.4  6.0 - 8.3 (g/dL)    Albumin 4.0  3.5 - 5.2 (g/dL)    AST 2952 (*) 0 - 37 (U/L)    ALT 1201 (*) 0 - 35 (U/L)    Alkaline Phosphatase 224 (*) 39 - 117 (U/L)    Total Bilirubin 3.5 (*) 0.3 - 1.2 (mg/dL)    GFR calc non Af Amer >90  >90 (mL/min)    GFR calc Af Amer >90  >90 (mL/min)   LIPASE, BLOOD     Status: Normal   Collection Time   10/24/11 10:50 AM      Component Value Range Comment   Lipase 26  11 - 59 (U/L)    No results found.  Review of Systems  Constitutional: Negative for fever  and chills.  HENT: Negative.   Eyes: Positive for blurred vision.  Respiratory: Negative.   Cardiovascular: Negative.   Gastrointestinal: Positive for abdominal pain.  Genitourinary: Negative.   Musculoskeletal: Negative.   Skin: Negative for itching and rash.  Neurological: Negative.   Endo/Heme/Allergies: Negative.   Psychiatric/Behavioral: Negative.     Blood pressure 138/98, pulse 74, temperature 98.7 F (37.1 C), temperature source Oral, resp. rate 16, last menstrual period 09/22/2011, SpO2 100.00%. Physical Exam  Constitutional: She is oriented to person, place, and time. She appears well-developed and well-nourished.  HENT:  Head: Normocephalic and atraumatic.  Eyes: EOM are normal. Pupils are equal, round, and reactive to light. No scleral icterus.  Neck: Normal range of motion.  Cardiovascular: Normal rate and regular rhythm.   Respiratory: Effort normal and breath sounds normal.  GI: Soft. She exhibits no distension and no mass. There is no tenderness. There is no rebound and no guarding.  Musculoskeletal: Normal range of motion.  Neurological: She is alert and oriented to person, place, and time.  Skin: Skin is warm and dry.  Psychiatric: She has a normal mood and affect. Her behavior is normal. Judgment and thought content normal.     Assessment/Plan POD 14 lap chole with possible CBD stone.  No peritonitis on exam so less likely bile leak.  Given the delay in presentation and history of impacted cystic duct stone CBD stone most likely.  Admit IVF CT scan and GI consult.  D/W pt and husband.  Tremayne Sheldon A. 10/24/2011, 1:48 PM

## 2011-10-24 NOTE — ED Notes (Signed)
Report received from rachel, rn.

## 2011-10-24 NOTE — Telephone Encounter (Signed)
Pt's husband calling while en route to pick up wife at the airport.  She had impacted gallstone at the time of her surgery and it has moved causing pain.  Dr. Luisa Hart made aware and pt directed to [facility] ER for work-up.  Paged [place], PA, to expect pt later this morning.

## 2011-10-24 NOTE — ED Notes (Signed)
Per pt.  Pt had gall stone removal 2 weeks ago by Dr. Luisa Hart.  MD unable to remove 1 of the gall stones, but said that stone should remain in place.  Pt stated that yesterday at 8am she began to had abd pain consistent with previous gall stone pain.  Pain has progressively gotten worse.

## 2011-10-24 NOTE — ED Provider Notes (Signed)
Medical screening examination/treatment/procedure(s) were performed by non-physician practitioner and as supervising physician I was immediately available for consultation/collaboration.  Ethelda Chick, MD 10/24/11 1356

## 2011-10-24 NOTE — Telephone Encounter (Signed)
Per VO Dr. Jamey Ripa, consult called to [facility] for pt with retained common bile duct stone.  Eval for ERCP.   Information given to [facility], who will contact Dr. Evette Cristal now.

## 2011-10-24 NOTE — ED Notes (Signed)
Report given to floor, rn. Pt will be transported to floor.

## 2011-10-24 NOTE — Consult Note (Signed)
History: The patient is a 41 year old female who is 2 weeks status post laparoscopic cholecystectomy. She was found to have gallstones. An intraoperative cholangiogram could not be performed because of an obstructing cystic duct stone. A followup MRCP showed no stones in the common bile duct or common hepatic duct. The patient was doing well postoperatively until yesterday when she developed repeat onset of right upper quadrant pain. Her liver enzymes were found to be elevated. A CT scan shows dilation of the proximal and mid common bile duct which is a new finding with relative narrowing and a common bile duct stone could not be ruled out.  Past Medical History  Diagnosis Date  . Headache 10-06-11    hx. migraines occ.  Merri Ray stones   . PONV (postoperative nausea and vomiting)    Past Surgical History  Procedure Date  . Tummy  10-06-11    11-'11Tummy Tuck  . Cesarean section 10-06-11    '04/ '05  . Appendectomy   . Tubal ligation   . Cholecystectomy 10/10/2011    Procedure: LAPAROSCOPIC CHOLECYSTECTOMY WITH INTRAOPERATIVE CHOLANGIOGRAM;  Surgeon: Clovis Pu. Cornett, MD;  Location: [facility];  Service: General;  Laterality: N/A;  Laparoscopic Cholecystectomy and Cholangiogram   No Known Allergies Current facility-administered medications:dextrose 5 % and 0.45 % NaCl with KCl 10 mEq/L infusion, , Intravenous, Continuous, Thomas A. Cornett, MD, Last Rate: 100 mL/hr at 10/24/11 1626;  HYDROmorphone (DILAUDID) injection 1 mg, 1 mg, Intravenous, Q2H PRN, Thomas A. Cornett, MD;  iohexol (OMNIPAQUE) 300 MG/ML solution 80 mL, 80 mL, Intravenous, Once PRN, Medication Radiologist, MD, 80 mL at 10/24/11 1652 morphine 4 MG/ML injection 4 mg, 4 mg, Intravenous, Once, [employer], PA, 4 mg at 10/24/11 1107;  ondansetron (ZOFRAN) injection 4 mg, 4 mg, Intravenous, Once, [employer], PA, 4 mg at 10/24/11 1106;  ondansetron (ZOFRAN) injection 4 mg, 4 mg, Intravenous, Q6H PRN, Maisie Fus A. Cornett, MD, 4 mg at  10/24/11 1626 History   Social History  . Marital Status: Married    Spouse Name: N/A    Number of Children: N/A  . Years of Education: N/A   Occupational History  . Not on file.   Social History Main Topics  . Smoking status: Never Smoker   . Smokeless tobacco: Never Used  . Alcohol Use: No  . Drug Use: No  . Sexually Active: Yes   Other Topics Concern  . Not on file   Social History Narrative  . No narrative on file    ROS She denies anginal chest pains or shortness of breath. She is currently not having any abdominal pain.  PHYSICAL EXAM: General: She is alert and oriented and in no acute distress  Skin nonicteric  Heart regular rhythm no murmurs  Lungs clear  Abdomen reveals normal bowel sounds it is soft and nontender  IMPRESSION: 1 right upper quadrant pain  2 probable common bile duct stone  PLAN; ERCP with sphincterotomy was discussed with the patient and her husband. The procedure was explained in detail along with the potential risks of bleeding, infection, perforation, and pancreatitis. She is agreeable and we will proceed with ERCP. Lab Results  Component Value Date   HGB 12.1 10/24/2011   HGB 10.9* 10/11/2011   HGB 11.7* 10/10/2011   HCT 35.7* 10/24/2011   HCT 32.1* 10/11/2011   HCT 34.4* 10/10/2011   ALKPHOS 219* 10/24/2011   ALKPHOS 224* 10/24/2011   ALKPHOS 60 10/11/2011   AST 1371* 10/24/2011   AST  1358* 10/24/2011   AST 67* 10/11/2011   ALT 1210* 10/24/2011   ALT 1201* 10/24/2011   ALT 53* 10/11/2011

## 2011-10-24 NOTE — ED Notes (Signed)
Pt alert and oriented x4. Respirations even and unlabored, bilateral symmetrical rise and fall of chest. Skin warm and dry. In no acute distress. Denies needs.   

## 2011-10-24 NOTE — ED Provider Notes (Signed)
History     CSN: [id number]  Arrival date & time 10/24/11  0946   First MD Initiated Contact with Patient 10/24/11 1002      Chief Complaint  Patient presents with  . Cholelithiasis    (Consider location/radiation/quality/duration/timing/severity/associated sxs/prior treatment) HPI  Patient who is approximately 2 weeks status post cholecystectomy by Dr. Luisa Hart presents to emergency department complaining of gradual onset epigastric to right upper quadrant pain that began yesterday morning at 8 AM. Patient states that she felt well after surgery but was told that she had an obstructing stone in her common bile duct status post cholecystectomy seen on MRCP and was seen postop visit on 4/2 and was told that obstructing stone may not cause any further complication however if she had recurrence of pain then to followup in office or the ER. Patient states that she was traveling in [place] yesterday when the pain occurred. Patient states pain is epigastric region and radiates towards her back and her right upper quadrant. Patient states pain is the same pain she had pre-cholecystectomy. Patient states she has not needed any Percocet since 2 days postop and since she was traveling on an airplane this morning took Tylenol alone for pain. Patient states Tylenol has helped decrease the pain. She denies any associated fevers, chills, chest pain, shortness of breath, nausea, vomiting, diarrhea, dysuria, hematuria, or blood in her stool. Patient states that when she returned to [place] this morning she called Dr. Rosezena Sensor office because of pain. She states she was instructed to return to the emergency department. She denies aggravating factors but states Tylenol did help relieve the pain to 2-3/10. Patient states pain was gradual onset and was increasing in severity until this morning with Tylenol use. She has no other known medical problems and takes no medicine on a regular basis.  Past Medical History    Diagnosis Date  . Headache 10-06-11    hx. migraines occ.  Merri Ray stones     Past Surgical History  Procedure Date  . Tummy  10-06-11    11-'11Tummy Tuck  . Cesarean section 10-06-11    '04/ '05  . Appendectomy   . Tubal ligation   . Cholecystectomy 10/10/2011    Procedure: LAPAROSCOPIC CHOLECYSTECTOMY WITH INTRAOPERATIVE CHOLANGIOGRAM;  Surgeon: Clovis Pu. Cornett, MD;  Location: [facility];  Service: General;  Laterality: N/A;  Laparoscopic Cholecystectomy and Cholangiogram    Family History  Problem Relation Age of Onset  . Colon cancer Mother   . Cancer Mother     colon  . Lung cancer Maternal Grandmother   . Lung cancer Maternal Grandfather     History  Substance Use Topics  . Smoking status: Never Smoker   . Smokeless tobacco: Not on file  . Alcohol Use: No    OB History    Grav Para Term Preterm Abortions TAB SAB Ect Mult Living                  Review of Systems  All other systems reviewed and are negative.    Allergies  Review of patient's allergies indicates no known allergies.  Home Medications   Current Outpatient Rx  Name Route Sig Dispense Refill  . ACETAMINOPHEN 500 MG PO TABS Oral Take 1,000 mg by mouth every 6 (six) hours as needed. For pain    . OXYCODONE-ACETAMINOPHEN 5-325 MG PO TABS Oral Take 1 tablet by mouth every 4 (four) hours as needed.      BP 138/98  Pulse 74  Temp(Src) 98.7 F (37.1 C) (Oral)  Resp 16  SpO2 100%  LMP 09/22/2011  Physical Exam  Nursing note and vitals reviewed. Constitutional: She is oriented to person, place, and time. She appears well-developed and well-nourished. No distress.  HENT:  Head: Normocephalic and atraumatic.  Eyes: Conjunctivae are normal.  Neck: Normal range of motion. Neck supple.  Cardiovascular: Normal rate, regular rhythm, normal heart sounds and intact distal pulses.  Exam reveals no gallop and no friction rub.   No murmur heard. Pulmonary/Chest: Effort normal and breath sounds normal.  No respiratory distress. She has no wheezes. She has no rales. She exhibits no tenderness.  Abdominal: Soft. Bowel sounds are normal. She exhibits no distension and no mass. There is tenderness. There is no rebound and no guarding.       Mild TTP of epigastric region but no rigidity or peritoneal signs.   Musculoskeletal: Normal range of motion. She exhibits no edema and no tenderness.  Neurological: She is alert and oriented to person, place, and time.  Skin: Skin is warm and dry. No rash noted. She is not diaphoretic. No erythema.  Psychiatric: She has a normal mood and affect.    ED Course  Procedures (including critical care time)  IV morphine and zofran  Labs Reviewed  CBC - Abnormal; Notable for the following:    HCT 35.7 (*)    All other components within normal limits  COMPREHENSIVE METABOLIC PANEL - Abnormal; Notable for the following:    AST 1358 (*)    ALT 1201 (*)    Alkaline Phosphatase 224 (*)    Total Bilirubin 3.5 (*)    All other components within normal limits  DIFFERENTIAL  LIPASE, BLOOD  POCT PREGNANCY, URINE   No results found.   1. Choledocholithiasis   2. Elevated liver enzymes     12:11 PM  MDM I spoke with Dr. Davina Poke regarding patient's lab findings regarding patient's lab findings. He states he'll send his physician assistant to the emergency department to admit the patient and have a GI consult for consideration of ERCP. Patient is resting comfortably in bed stating that pain has resolved. We'll continue to monitor in the ER until admission.          Jenness Corner, [place] 10/24/11 1215

## 2011-10-25 ENCOUNTER — Encounter (HOSPITAL_COMMUNITY)
Admission: EM | Disposition: A | Discharge status: Home / Self Care | Payer: Self-pay | Source: Home / Self Care | Attending: Surgery

## 2011-10-25 ENCOUNTER — Encounter (HOSPITAL_COMMUNITY): Payer: Self-pay | Admitting: *Deleted

## 2011-10-25 ENCOUNTER — Inpatient Hospital Stay (HOSPITAL_COMMUNITY): Payer: Managed Care, Other (non HMO)

## 2011-10-25 HISTORY — PX: ERCP: SHX5425

## 2011-10-25 LAB — CBC
HCT: 34 % — ABNORMAL LOW (ref 36.0–46.0)
Hemoglobin: 11.2 g/dL — ABNORMAL LOW (ref 12.0–15.0)
MCH: 30.6 pg (ref 26.0–34.0)
MCV: 92.9 fL (ref 78.0–100.0)
RBC: 3.66 MIL/uL — ABNORMAL LOW (ref 3.87–5.11)

## 2011-10-25 LAB — COMPREHENSIVE METABOLIC PANEL
AST: 620 U/L — ABNORMAL HIGH (ref 0–37)
Albumin: 3.3 g/dL — ABNORMAL LOW (ref 3.5–5.2)
Calcium: 8.7 mg/dL (ref 8.4–10.5)
Creatinine, Ser: 0.69 mg/dL (ref 0.50–1.10)
Total Protein: 6.3 g/dL (ref 6.0–8.3)

## 2011-10-25 SURGERY — ERCP, WITH INTERVENTION IF INDICATED
Anesthesia: Moderate Sedation

## 2011-10-25 MED ORDER — MIDAZOLAM HCL 10 MG/2ML IJ SOLN
INTRAMUSCULAR | Status: DC | PRN
  Administered 2011-10-25 (×7): 2 mg via INTRAVENOUS

## 2011-10-25 MED ORDER — BUTAMBEN-TETRACAINE-BENZOCAINE 2-2-14 % EX AERO
INHALATION_SPRAY | CUTANEOUS | Status: DC | PRN
  Administered 2011-10-25: 2 via TOPICAL

## 2011-10-25 MED ORDER — GLYCOPYRROLATE 0.2 MG/ML IJ SOLN
INTRAMUSCULAR | Status: DC | PRN
  Administered 2011-10-25: 0.2 mg via INTRAVENOUS

## 2011-10-25 MED ORDER — GLUCAGON HCL (RDNA) 1 MG IJ SOLR
INTRAMUSCULAR | Status: DC | PRN
  Administered 2011-10-25: .5 mg via INTRAVENOUS

## 2011-10-25 MED ORDER — GLUCAGON HCL (RDNA) 1 MG IJ SOLR
INTRAMUSCULAR | Status: AC
  Filled 2011-10-25: qty 2

## 2011-10-25 MED ORDER — SODIUM CHLORIDE 0.9 % IV SOLN
1.5000 g | Freq: Once | INTRAVENOUS | Status: AC
  Administered 2011-10-25: 1.5 g via INTRAVENOUS
  Filled 2011-10-25: qty 1.5

## 2011-10-25 MED ORDER — FENTANYL CITRATE 0.05 MG/ML IJ SOLN
INTRAMUSCULAR | Status: DC | PRN
  Administered 2011-10-25 (×5): 25 ug via INTRAVENOUS

## 2011-10-25 MED ORDER — DIPHENHYDRAMINE HCL 50 MG/ML IJ SOLN
INTRAMUSCULAR | Status: DC | PRN
  Administered 2011-10-25 (×2): 25 mg via INTRAVENOUS

## 2011-10-25 MED ORDER — MIDAZOLAM HCL 10 MG/2ML IJ SOLN
INTRAMUSCULAR | Status: AC
  Filled 2011-10-25: qty 4

## 2011-10-25 MED ORDER — GLYCOPYRROLATE 0.2 MG/ML IJ SOLN
INTRAMUSCULAR | Status: AC
  Filled 2011-10-25: qty 1

## 2011-10-25 MED ORDER — FENTANYL CITRATE 0.05 MG/ML IJ SOLN
INTRAMUSCULAR | Status: AC
  Filled 2011-10-25: qty 4

## 2011-10-25 MED ORDER — CIPROFLOXACIN IN D5W 400 MG/200ML IV SOLN
INTRAVENOUS | Status: AC
  Filled 2011-10-25: qty 200

## 2011-10-25 MED ORDER — DIPHENHYDRAMINE HCL 50 MG/ML IJ SOLN
INTRAMUSCULAR | Status: AC
  Filled 2011-10-25: qty 1

## 2011-10-25 NOTE — Progress Notes (Signed)
  Subjective: Pain is better, but still having N/V.  Objective: Vital signs in last 24 hours: Temp:  [97.4 F (36.3 C)-98.7 F (37.1 C)] 97.8 F (36.6 C) (04/13 0627) Pulse Rate:  [65-74] 66  (04/13 0627) Resp:  [14-16] 16  (04/13 0627) BP: (111-138)/(70-98) 119/80 mmHg (04/13 0627) SpO2:  [97 %-100 %] 99 % (04/13 0627) Weight:  [140 lb 8 oz (63.73 kg)] 140 lb 8 oz (63.73 kg) (04/12 1515) Last BM Date: 10/24/11  Intake/Output from previous day: 04/12 0701 - 04/13 0700 In: 1787.7 [I.V.:1787.7] Out: -  Intake/Output this shift:    PE: Abd-soft, nontender  Lab Results:   Basename 10/25/11 0408 10/24/11 1050  WBC 5.0 4.9  HGB 11.2* 12.1  HCT 34.0* 35.7*  PLT 271 284   BMET  Basename 10/25/11 0408 10/24/11 1050  NA 138 139  K 4.0 4.1  CL 104 104  CO2 25 25  GLUCOSE 103* 85  BUN 5* 9  CREATININE 0.69 0.74  CALCIUM 8.7 9.0   PT/INR No results found for this basename: LABPROT:2,INR:2 in the last 72 hours Comprehensive Metabolic Panel:    Component Value Date/Time   NA 138 10/25/2011 0408   K 4.0 10/25/2011 0408   CL 104 10/25/2011 0408   CO2 25 10/25/2011 0408   BUN 5* 10/25/2011 0408   CREATININE 0.69 10/25/2011 0408   GLUCOSE 103* 10/25/2011 0408   CALCIUM 8.7 10/25/2011 0408   AST 620* 10/25/2011 0408   ALT 855* 10/25/2011 0408   ALKPHOS 201* 10/25/2011 0408   BILITOT 4.4* 10/25/2011 0408   PROT 6.3 10/25/2011 0408   ALBUMIN 3.3* 10/25/2011 0408     Studies/Results: Ct Abdomen Pelvis W Contrast  10/24/2011  *RADIOLOGY REPORT*  Clinical Data: 41 year old female with abdominal pain, jaundice and elevated LFTs. Cholecystectomy 2 weeks ago.  CT ABDOMEN AND PELVIS WITH CONTRAST  Technique:  Multidetector CT imaging of the abdomen and pelvis was performed following the standard protocol during bolus administration of intravenous contrast.  Contrast:  80 ml intravenous Omnipaque-300.  Comparison: 10/10/2011 MRCP, 10/03/2011 ultrasound and 02/26/2011 CT  Findings:  Evidence of cholecystectomy noted with stranding and small amount of fluid in the cholecystectomy bed, not unexpected following recent cholecystectomy. There has been interval CBD dilatation measuring up to 13 mm in diameter.  The very distal CBD is not dilated and there is a relative transition point within the mid - distal CBD - an obstructing calculus is not excluded. The spleen, pancreas, adrenal glands, kidneys and liver are otherwise unremarkable.  Tubal ligation clips are present.  There is no evidence of bowel obstruction, pneumoperitoneum, enlarged lymph nodes or abdominal aortic aneurysm. The bowel and bladder are within normal limits.  No acute or suspicious bony abnormalities are identified.  IMPRESSION: Dilatation of the proximal and mid CBD with relative area of narrowing at the mid - distal CBD.  An obstructing calculus is not excluded.  Small amount of inflammation and fluid in the cholecystectomy bed. This is not unexpected postoperatively, but consider nuclear medicine study if there is clinical concern for bile leak.  No other significant abnormalities identified.  Original Report Authenticated By: Rosendo Gros, M.D.    Anti-infectives: Anti-infectives    None      Assessment Active Problems: Obstructive jaundice likely due to CBD stone    LOS: 1 day   Plan: ERCP today.   Aleja Yearwood J 10/25/2011

## 2011-10-25 NOTE — Op Note (Signed)
[facility] [address] [place], [place]  [address]  ERCP PROCEDURE REPORT  PATIENT:  [name], [name]  MR#:  [id number] BIRTHDATE:  01/16/1971  GENDER:  female  ENDOSCOPIST:  Wandalee Ferdinand, MD ASSISTANT:  Anthony Sar, RN, Kizzie Bane, Judithann Sauger, RN  PROCEDURE DATE:  10/25/2011 PROCEDURE: Attempted ERCP ASA CLASS: 2  INDICATIONS: Jaundice, elevated liver enzymes, suspect common bile duct stone. Patient is status post laparoscopic cholecystectomy.   MEDICATIONS: Fentanyl 125 mcg IV, Versed 12 mg IV, Benadryl 50 mg IV, Glucotrol and 0.5 mg IV, Robinul 0.2 mg IV TOPICAL ANESTHETIC:  DESCRIPTION OF PROCEDURE:   After the risks benefits and alternatives of the procedure were thoroughly explained, informed consent was obtained.  The Pentax ERCP [id number] endoscope was introduced through the mouth and advanced to the second portion of the duodenum. The papilla of Vater was located. The orifice of the papilla appeared somewhat inflamed as though a stone had passed through it at some point recently. I was able to see bile draining out of it. A a sphincterotome with guidewire was advanced down the scope and numerous attempts were made trying to selectively cannulate the bile duct but these were all unsuccessful. A couple of passes of a guidewire did go into the pancreatic duct which were visualized on fluoroscopy and despite repositioning I could not get the guidewire to advance up into the bile duct. The papilla of Vater also appeared to be somewhat floppy and angulated inwards towards the wall of the duodenum and despite all maneuvers I could never get the papilla to be in what I consider to be a really good position. The guidewire and catheter always seemed to angle towards the 11:00 position and I could never get it to ankle towards the 1:00 position for cannulation. No contrast was injected into the biliary tree or pancreatic  duct.  . <<PROCEDUREIMAGES>>  COMPLICATIONS:  None  ENDOSCOPIC IMPRESSION: Unsuccessful cannulation of biliary tree  RECOMMENDATIONS: I would recommend reattempt ERCP in a couple of days. I will check with one of my partners to attempt the procedure again in a couple of days  ______________________________ Wandalee Ferdinand, MD  CC:  n. eSIGNED:   Sam Dmauri Rosenow at 10/25/2011 01:01 PM  [name], [phone]

## 2011-10-26 DIAGNOSIS — K831 Obstruction of bile duct: Secondary | ICD-10-CM

## 2011-10-26 HISTORY — DX: Obstruction of bile duct: K83.1

## 2011-10-26 MED ORDER — ONDANSETRON HCL 4 MG/2ML IJ SOLN: 4.0000 mg | INTRAMUSCULAR | Status: DC | PRN

## 2011-10-26 MED ORDER — DIPHENHYDRAMINE HCL 25 MG PO CAPS
25.0000 mg | ORAL_CAPSULE | ORAL | Status: DC | PRN
  Administered 2011-10-26 – 2011-10-27 (×4): 25 mg via ORAL
  Filled 2011-10-26 (×4): qty 1

## 2011-10-26 NOTE — Progress Notes (Signed)
Patient ID: [name], female   DOB: [birth year], 41 y.o.   MRN: [id number] [facility] Progress Note  Gabbi Whetstone 41 y.o. [birth year]   Subjective: Nausea and abdominal pain. Failed ERCP yesterday (unable to cannulate CBD). Tolerating full liquids without worsened nausea or pain.  Objective: Vital signs: Filed Vitals:   10/26/11 0655  BP: 104/65  Pulse: 64  Temp: 98.7 F (37.1 C)  Resp: 20    Intake/Output last 24 hrs:  Intake/Output Summary (Last 24 hours) at 10/26/11 1256 Last data filed at 10/26/11 1100  Gross per 24 hour  Intake   2971 ml  Output      0 ml  Net   2971 ml    Physical Exam: Gen: alert, no acute distress  Abd: epigastric tenderness with guarding, soft, nondistended, positive bowel sounds  Lab Results:  Basename 10/25/11 0408 10/24/11 1050  NA 138 139  K 4.0 4.1  CL 104 104  CO2 25 25  GLUCOSE 103* 85  BUN 5* 9  CREATININE 0.69 0.74  CALCIUM 8.7 9.0  MG -- --  PHOS -- --    Basename 10/25/11 0408 10/24/11 1435  AST 620* 1371*  ALT 855* 1210*  ALKPHOS 201* 219*  BILITOT 4.4* 3.4*  PROT 6.3 7.4  ALBUMIN 3.3* 4.0    Basename 10/25/11 0408 10/24/11 1050  WBC 5.0 4.9  NEUTROABS -- 3.6  HGB 11.2* 12.1  HCT 34.0* 35.7*  MCV 92.9 92.2  PLT 271 284     Medications: I have reviewed the patient's current medications.  Assessment/Plan: 40yo s/p recent lap chole with obstructive jaundice concerning for CBD stone and attempted ERCP yesterday by Dr.Ganem. Unable to cannulate CBD. Patient with intermittent nausea and abdominal pain. Liver enyzmes improving. Dr. Ewing Schlein willing to try an ERCP on Tuesday and pt could go home if better until that time but with her nausea and abdominal pain would not recommend D/C today (pt not feeling good enough to go home anyway). Will advance diet at her request and see if she tolerates. Follow labs and plan for repeat ERCP most likely on Tuesday.   Ceana Fiala C. 10/26/2011, 12:56  PM

## 2011-10-26 NOTE — Progress Notes (Signed)
1 Day Post-Op  Subjective: Less nausea.  Still with intermittent abdominal pain.  Unable to have CBD cannulated on ERCP.  Objective: Vital signs in last 24 hours: Temp:  [97.9 F (36.6 C)-98.7 F (37.1 C)] 98.7 F (37.1 C) (04/14 0655) Pulse Rate:  [62-67] 64  (04/14 0655) Resp:  [12-20] 20  (04/14 0655) BP: (100-173)/(56-105) 104/65 mmHg (04/14 0655) SpO2:  [97 %-100 %] 97 % (04/14 0655) Last BM Date: 10/24/11  Intake/Output from previous day: 04/13 0701 - 04/14 0700 In: 2611 [P.O.:240; I.V.:2371] Out: -  Intake/Output this shift:    PE: Abd-soft, nontender  Lab Results:   Basename 10/25/11 0408 10/24/11 1050  WBC 5.0 4.9  HGB 11.2* 12.1  HCT 34.0* 35.7*  PLT 271 284   BMET  Basename 10/25/11 0408 10/24/11 1050  NA 138 139  K 4.0 4.1  CL 104 104  CO2 25 25  GLUCOSE 103* 85  BUN 5* 9  CREATININE 0.69 0.74  CALCIUM 8.7 9.0   PT/INR No results found for this basename: LABPROT:2,INR:2 in the last 72 hours Comprehensive Metabolic Panel:    Component Value Date/Time   NA 138 10/25/2011 0408   K 4.0 10/25/2011 0408   CL 104 10/25/2011 0408   CO2 25 10/25/2011 0408   BUN 5* 10/25/2011 0408   CREATININE 0.69 10/25/2011 0408   GLUCOSE 103* 10/25/2011 0408   CALCIUM 8.7 10/25/2011 0408   AST 620* 10/25/2011 0408   ALT 855* 10/25/2011 0408   ALKPHOS 201* 10/25/2011 0408   BILITOT 4.4* 10/25/2011 0408   PROT 6.3 10/25/2011 0408   ALBUMIN 3.3* 10/25/2011 0408     Studies/Results: Ct Abdomen Pelvis W Contrast  10/24/2011  *RADIOLOGY REPORT*  Clinical Data: 41 year old female with abdominal pain, jaundice and elevated LFTs. Cholecystectomy 2 weeks ago.  CT ABDOMEN AND PELVIS WITH CONTRAST  Technique:  Multidetector CT imaging of the abdomen and pelvis was performed following the standard protocol during bolus administration of intravenous contrast.  Contrast:  80 ml intravenous Omnipaque-300.  Comparison: 10/10/2011 MRCP, 10/03/2011 ultrasound and 02/26/2011 CT  Findings:  Evidence of cholecystectomy noted with stranding and small amount of fluid in the cholecystectomy bed, not unexpected following recent cholecystectomy. There has been interval CBD dilatation measuring up to 13 mm in diameter.  The very distal CBD is not dilated and there is a relative transition point within the mid - distal CBD - an obstructing calculus is not excluded. The spleen, pancreas, adrenal glands, kidneys and liver are otherwise unremarkable.  Tubal ligation clips are present.  There is no evidence of bowel obstruction, pneumoperitoneum, enlarged lymph nodes or abdominal aortic aneurysm. The bowel and bladder are within normal limits.  No acute or suspicious bony abnormalities are identified.  IMPRESSION: Dilatation of the proximal and mid CBD with relative area of narrowing at the mid - distal CBD.  An obstructing calculus is not excluded.  Small amount of inflammation and fluid in the cholecystectomy bed. This is not unexpected postoperatively, but consider nuclear medicine study if there is clinical concern for bile leak.  No other significant abnormalities identified.  Original Report Authenticated By: Rosendo Gros, M.D.    Anti-infectives: Anti-infectives     Start     Dose/Rate Route Frequency Ordered Stop   10/25/11 1030   ampicillin-sulbactam (UNASYN) 1.5 g in sodium chloride 0.9 % 50 mL IVPB        1.5 g 100 mL/hr over 30 Minutes Intravenous  Once 10/25/11 1010 10/25/11 1046  Assessment Principal Problem:  *Obstructive jaundice likely due to choledocholithiasis-s/p attempted ERCP yesterday.    LOS: 2 days   Plan: Liquid diet.  Hopefully can have another ERCP attempt in the next day or two per GI.   Mandy Peeks J 10/26/2011

## 2011-10-27 ENCOUNTER — Encounter (HOSPITAL_COMMUNITY): Payer: Self-pay | Admitting: Gastroenterology

## 2011-10-27 ENCOUNTER — Encounter (INDEPENDENT_AMBULATORY_CARE_PROVIDER_SITE_OTHER): Payer: Managed Care, Other (non HMO) | Admitting: Surgery

## 2011-10-27 LAB — CBC
MCH: 30 pg (ref 26.0–34.0)
MCHC: 31.9 g/dL (ref 30.0–36.0)
MCV: 94.2 fL (ref 78.0–100.0)
Platelets: 260 10*3/uL (ref 150–400)
RDW: 13.4 % (ref 11.5–15.5)

## 2011-10-27 LAB — COMPREHENSIVE METABOLIC PANEL
ALT: 578 U/L — ABNORMAL HIGH (ref 0–35)
AST: 249 U/L — ABNORMAL HIGH (ref 0–37)
Albumin: 3.3 g/dL — ABNORMAL LOW (ref 3.5–5.2)
Alkaline Phosphatase: 201 U/L — ABNORMAL HIGH (ref 39–117)
CO2: 28 mEq/L (ref 19–32)
Chloride: 99 mEq/L (ref 96–112)
Potassium: 3.6 mEq/L (ref 3.5–5.1)
Total Bilirubin: 4.8 mg/dL — ABNORMAL HIGH (ref 0.3–1.2)

## 2011-10-27 MED ORDER — DIPHENHYDRAMINE HCL 50 MG/ML IJ SOLN
25.0000 mg | Freq: Four times a day (QID) | INTRAMUSCULAR | Status: DC | PRN
  Administered 2011-10-27 – 2011-10-29 (×6): 25 mg via INTRAVENOUS
  Filled 2011-10-27 (×6): qty 1

## 2011-10-27 NOTE — Progress Notes (Signed)
CARE MANAGEMENT NOTE 10/27/2011  Patient:  [name],[name]   Account Number:  [id number]  Date Initiated:  10/27/2011  Documentation initiated by:  PEARSON,COOKIE  Subjective/Objective Assessment:   41 y.o. pt admitted with  Chief Complaint: RUQ pain  2 weeks after Lap chole     Action/Plan:   plan to dc home with no needs   Anticipated DC Date:  10/29/2011   Anticipated DC Plan:  HOME/SELF CARE         Choice offered to / List presented to:             Status of service:  In process, will continue to follow Medicare Important Message given?  NO (If response is "NO", the following Medicare IM given date fields will be blank) Date Medicare IM given:   Date Additional Medicare IM given:    Discharge Disposition:    Per UR Regulation:  Reviewed for med. necessity/level of care/duration of stay  If discussed at Long Length of Stay Meetings, dates discussed:    Comments:  10/27/11 MPearson, RN, BSN Plan to discharge home with no needs at present time. Chart reviewed.

## 2011-10-27 NOTE — Progress Notes (Signed)
[facility] PROGRESS NOTE Subjective Patient feels okay. Having some intermittent pain. She is taking pain medicines and tolerating liquids. Had some solid food last night.  Objective: Vital signs in last 24 hours: Temp:  [98.3 F (36.8 C)-98.7 F (37.1 C)] 98.3 F (36.8 C) (04/15 0448) Pulse Rate:  [72-87] 72  (04/15 0448) Resp:  [18-20] 20  (04/15 0448) BP: (106-116)/(70-82) 106/73 mmHg (04/15 0448) SpO2:  [97 %-98 %] 98 % (04/15 0448) Last BM Date: 10/24/11  Intake/Output from previous day: 04/14 0701 - 04/15 0700 In: 2236 [P.O.:600; I.V.:1636] Out: -  Intake/Output this shift:    PE: Gen.-alert and oriented no complaints Abdomen-soft minimal tenderness positive bowel  Lab Results:  Basename 10/27/11 0406 10/25/11 0408 10/24/11 1050  WBC 5.1 5.0 4.9  HGB 10.9* 11.2* 12.1  HCT 34.2* 34.0* 35.7*  PLT 260 271 284   BMET  Basename 10/27/11 0406 10/25/11 0408 10/24/11 1050  NA 135 138 139  K 3.6 4.0 4.1  CL 99 104 104  CO2 28 25 25   CREATININE 0.70 0.69 0.74   LFT  Basename 10/27/11 0406 10/25/11 0408 10/24/11 1435  PROT 6.4 6.3 7.4  AST 249* 620* 1371*  ALT 578* 855* 1210*  ALKPHOS 201* 201* 219*  BILITOT 4.8* 4.4* 3.4*  BILIDIR -- -- 2.3*  IBILI -- -- 1.1*   PT/INR No results found for this basename: LABPROT:3,INR:3 in the last 72 hours PANCREAS  Basename 10/24/11 1050  LIPASE 26         Studies/Results: No results found.  Medications: I have reviewed the patient's current medications.  Assessment/Plan: 1. CBD obstruction. Almost certainly due to common bile duct stone. Bilirubin still elevated the patient afebrile normal white count. Repeat ERCP planned for tomorrow at 1:30 by Dr. Ewing Schlein. Propofol sedation be used. Have discussed this in detail with the patient including the risk and the possibility of other types of procedures etc. if the procedure fails again tomorrow. She has questions and they were answered.   Hartley Urton JR,Tamim Skog  L 10/27/2011, 7:51 AM

## 2011-10-27 NOTE — Progress Notes (Signed)
2 Days Post-Op  Subjective: Itching from pain meds.  Objective: Vital signs in last 24 hours: Temp:  [98.3 F (36.8 C)-98.7 F (37.1 C)] 98.3 F (36.8 C) (04/15 0448) Pulse Rate:  [72-87] 72  (04/15 0448) Resp:  [18-20] 20  (04/15 0448) BP: (106-116)/(70-82) 106/73 mmHg (04/15 0448) SpO2:  [97 %-98 %] 98 % (04/15 0448) Last BM Date: 10/24/11  Intake/Output from previous day: 04/14 0701 - 04/15 0700 In: 2236 [P.O.:600; I.V.:1636] Out: -  Intake/Output this shift:    soft non tender abdomen.  Lab Results:   Basename 10/27/11 0406 10/25/11 0408  WBC 5.1 5.0  HGB 10.9* 11.2*  HCT 34.2* 34.0*  PLT 260 271   BMET  Basename 10/27/11 0406 10/25/11 0408  NA 135 138  K 3.6 4.0  CL 99 104  CO2 28 25  GLUCOSE 97 103*  BUN 3* 5*  CREATININE 0.70 0.69  CALCIUM 8.8 8.7   PT/INR No results found for this basename: LABPROT:2,INR:2 in the last 72 hours ABG No results found for this basename: PHART:2,PCO2:2,PO2:2,HCO3:2 in the last 72 hours  Studies/Results: No results found.  Anti-infectives: Anti-infectives     Start     Dose/Rate Route Frequency Ordered Stop   10/25/11 1030   ampicillin-sulbactam (UNASYN) 1.5 g in sodium chloride 0.9 % 50 mL IVPB        1.5 g 100 mL/hr over 30 Minutes Intravenous  Once 10/25/11 1010 10/25/11 1046          Assessment/Plan: s/p Procedure(s) (LRB): CBD stone ENDOSCOPIC RETROGRADE CHOLANGIOPANCREATOGRAPHY (ERCP) (N/A) For repeat ERCP Tuesday.  Did discuss CBD exploration as option vs transfer to tertiary care for ERCP elsewhere.  She would prefer minimally invasive options if possible.  Appreciate GI help. LFT'S stable.  LOS: 3 days    Florina Glas A. 10/27/2011

## 2011-10-28 ENCOUNTER — Encounter (HOSPITAL_COMMUNITY)
Admission: EM | Disposition: A | Discharge status: Home / Self Care | Payer: Self-pay | Source: Home / Self Care | Attending: Surgery

## 2011-10-28 ENCOUNTER — Encounter (HOSPITAL_COMMUNITY): Payer: Self-pay | Admitting: Registered Nurse

## 2011-10-28 ENCOUNTER — Inpatient Hospital Stay (HOSPITAL_COMMUNITY): Payer: Managed Care, Other (non HMO) | Admitting: Registered Nurse

## 2011-10-28 ENCOUNTER — Inpatient Hospital Stay (HOSPITAL_COMMUNITY): Payer: Managed Care, Other (non HMO)

## 2011-10-28 ENCOUNTER — Encounter (HOSPITAL_COMMUNITY): Payer: Self-pay | Admitting: Gastroenterology

## 2011-10-28 ENCOUNTER — Encounter (HOSPITAL_COMMUNITY): Payer: Self-pay | Admitting: *Deleted

## 2011-10-28 HISTORY — PX: ERCP: SHX5425

## 2011-10-28 LAB — HEPATIC FUNCTION PANEL
Alkaline Phosphatase: 233 U/L — ABNORMAL HIGH (ref 39–117)
Indirect Bilirubin: 1.3 mg/dL — ABNORMAL HIGH (ref 0.3–0.9)
Total Protein: 7.1 g/dL (ref 6.0–8.3)

## 2011-10-28 SURGERY — ERCP, WITH INTERVENTION IF INDICATED
Anesthesia: General

## 2011-10-28 MED ORDER — DEXAMETHASONE SODIUM PHOSPHATE 10 MG/ML IJ SOLN
INTRAMUSCULAR | Status: DC | PRN
  Administered 2011-10-28: 10 mg via INTRAVENOUS

## 2011-10-28 MED ORDER — GLUCAGON HCL (RDNA) 1 MG IJ SOLR
INTRAMUSCULAR | Status: AC
  Filled 2011-10-28: qty 1

## 2011-10-28 MED ORDER — PROPOFOL 10 MG/ML IV EMUL
INTRAVENOUS | Status: DC | PRN
  Administered 2011-10-28: 150 mg via INTRAVENOUS

## 2011-10-28 MED ORDER — MAGNESIUM HYDROXIDE 400 MG/5ML PO SUSP: 30.0000 mL | Freq: Two times a day (BID) | ORAL | Status: DC | PRN

## 2011-10-28 MED ORDER — HYDROMORPHONE HCL PF 1 MG/ML IJ SOLN
0.5000 mg | INTRAMUSCULAR | Status: DC | PRN
  Administered 2011-10-29: 1 mg via INTRAVENOUS
  Filled 2011-10-28: qty 1

## 2011-10-28 MED ORDER — SODIUM CHLORIDE 0.9 % IV SOLN
INTRAVENOUS | Status: DC | PRN
  Administered 2011-10-28: 15:00:00

## 2011-10-28 MED ORDER — DROPERIDOL 2.5 MG/ML IJ SOLN
INTRAMUSCULAR | Status: DC | PRN
  Administered 2011-10-28: 0.625 mg via INTRAVENOUS

## 2011-10-28 MED ORDER — ONDANSETRON HCL 4 MG/2ML IJ SOLN
INTRAMUSCULAR | Status: DC | PRN
  Administered 2011-10-28: 4 mg via INTRAVENOUS

## 2011-10-28 MED ORDER — SODIUM CHLORIDE 0.9 % IV SOLN: Freq: Once | INTRAVENOUS | Status: AC

## 2011-10-28 MED ORDER — SODIUM CHLORIDE 0.9 % IV SOLN
1.5000 g | Freq: Once | INTRAVENOUS | Status: AC
  Administered 2011-10-28: 1.5 g via INTRAVENOUS
  Filled 2011-10-28: qty 1.5

## 2011-10-28 MED ORDER — OXYCODONE HCL 5 MG PO TABS
5.0000 mg | ORAL_TABLET | ORAL | Status: DC | PRN
  Administered 2011-10-28: 10 mg via ORAL
  Filled 2011-10-28: qty 2

## 2011-10-28 MED ORDER — ALUM & MAG HYDROXIDE-SIMETH 200-200-20 MG/5ML PO SUSP: 30.0000 mL | Freq: Four times a day (QID) | ORAL | Status: DC | PRN

## 2011-10-28 MED ORDER — GLYCOPYRROLATE 0.2 MG/ML IJ SOLN
INTRAMUSCULAR | Status: DC | PRN
  Administered 2011-10-28: .6 mg via INTRAVENOUS

## 2011-10-28 MED ORDER — ACETAMINOPHEN 325 MG PO TABS
650.0000 mg | ORAL_TABLET | Freq: Four times a day (QID) | ORAL | Status: DC
  Administered 2011-10-28 – 2011-10-29 (×2): 650 mg via ORAL
  Filled 2011-10-28 (×6): qty 2

## 2011-10-28 MED ORDER — SODIUM CHLORIDE 0.9 % IV SOLN
INTRAVENOUS | Status: AC
  Filled 2011-10-28: qty 1.5

## 2011-10-28 MED ORDER — FENTANYL CITRATE 0.05 MG/ML IJ SOLN
INTRAMUSCULAR | Status: DC | PRN
  Administered 2011-10-28: 50 ug via INTRAVENOUS
  Administered 2011-10-28: 25 ug via INTRAVENOUS
  Administered 2011-10-28 (×2): 50 ug via INTRAVENOUS
  Administered 2011-10-28: 25 ug via INTRAVENOUS

## 2011-10-28 MED ORDER — ROCURONIUM BROMIDE 100 MG/10ML IV SOLN
INTRAVENOUS | Status: DC | PRN
  Administered 2011-10-28: 5 mg via INTRAVENOUS
  Administered 2011-10-28: 30 mg via INTRAVENOUS

## 2011-10-28 MED ORDER — LIDOCAINE HCL (CARDIAC) 20 MG/ML IV SOLN
INTRAVENOUS | Status: DC | PRN
  Administered 2011-10-28: 80 mg via INTRAVENOUS

## 2011-10-28 MED ORDER — SODIUM CHLORIDE 0.9 % IV SOLN: 1.5000 g | Freq: Once | INTRAVENOUS | Status: DC

## 2011-10-28 MED ORDER — NAPROXEN 500 MG PO TABS
500.0000 mg | ORAL_TABLET | Freq: Two times a day (BID) | ORAL | Status: DC | PRN
  Filled 2011-10-28: qty 1

## 2011-10-28 MED ORDER — NEOSTIGMINE METHYLSULFATE 1 MG/ML IJ SOLN
INTRAMUSCULAR | Status: DC | PRN
  Administered 2011-10-28: 4 mg via INTRAVENOUS

## 2011-10-28 MED ORDER — LACTATED RINGERS IV SOLN
INTRAVENOUS | Status: DC | PRN
  Administered 2011-10-28: 12:00:00 via INTRAVENOUS

## 2011-10-28 NOTE — Anesthesia Postprocedure Evaluation (Signed)
  Anesthesia Post-op Note  Patient: [name]  Procedure(s) Performed: Procedure(s) (LRB): ENDOSCOPIC RETROGRADE CHOLANGIOPANCREATOGRAPHY (ERCP) (N/A)  Patient Location: PACU  Anesthesia Type: General  Level of Consciousness: awake and alert   Airway and Oxygen Therapy: Patient Spontanous Breathing  Post-op Pain: mild  Post-op Assessment: Post-op Vital signs reviewed, Patient's Cardiovascular Status Stable, Respiratory Function Stable, Patent Airway and No signs of Nausea or vomiting  Post-op Vital Signs: stable  Complications: No apparent anesthesia complications

## 2011-10-28 NOTE — Op Note (Signed)
[facility] [address] [place], [place]  [address]  ERCP PROCEDURE REPORT  PATIENT:  [name], [name]  MR#:  [id number] BIRTHDATE:  1971/03/11  GENDER:  female  ENDOSCOPIST:  Vida Rigger, MD ASSISTANT:  Beryle Beams, Technician, Dwain Sarna, RN CGRN  PROCEDURE DATE:  10/28/2011 PROCEDURE:  ERCP with balloon dilation, ERCP with balloon passage, ERCP with removal of stones, ERCP with sphincterotomy ASA CLASS:  Class I  INDICATIONS:  suspected stone  MEDICATIONS:  Per anesthesia TOPICAL ANESTHETIC: None  DESCRIPTION OF PROCEDURE:   After the risks benefits and alternatives of the procedure were thoroughly explained, informed consent was obtained.  The Pentax ERCP [id number] endoscope was introduced through the mouth and advanced to the second portion of the duodenum. A slightly bulbus but normal ampulla was brought into view and using the triple lumen sphincterotome loaded with the JAG Jagwire deep selective cannulation was obtained. There seemed to be a distal stricture very short and then at least a few tiny or small stones. No pancreatic duct injections or wire advancements were done and we proceeded with the customary sphincterotomy in the customary fashion until we had adequate biliary drainage and could insert the fully bowed sphincterotome easily into the duct. We then went ahead and exchanged the balloon catheter for the sphincterotome but unfortunately popped the balloon in an attempt on pull-through using the 12-15 mm adjustable balloon inflated to 12 mm we then went ahead and increase the sphincterotomy site in the customary fashion and proceeded with distal duct balloon dilation using the 10 mm by 4 cm: Balloon and then we proceeded with balloon pull-through using the adjustable balloon 12 mm and 2 small stones were removed and subsequent balloon pull-throughs were normal without resistance. We then proceeded with an occlusion cholangiogram which was  normal without residual filling and adequate biliary drainage. We elected to stop the procedure at this juncture the wire and catheter were removed the scope was removed and the patient tolerated the procedure well and there was no obvious immediate complication  <<PROCEDUREIMAGES>>  COMPLICATIONS:  A complication of none occurred on 10/28/2011 at.  ENDOSCOPIC IMPRESSION: 1) Stenosis in the common bile duct very short status post sphincterotomy and balloon dilation 2) Stones in the common bile duct status post removal 3. Normal occlusion cholangiogram at the end of the procedure 4. No PD injections or wire advancements 5. No cystic duct remnant injections RECOMMENDATIONS: Observe for delayed complication if none advanced diet tomorrow and hopefully home soon and follow liver tests as an outpatient back to normal and happy to see back when necessary and will ask my partner to check on tomorrow  ______________________________ Vida Rigger, MD  CC:  n. eSIGNEDVida Rigger at 10/28/2011 03:12 PM  [name], [id number]

## 2011-10-28 NOTE — Anesthesia Preprocedure Evaluation (Addendum)
Anesthesia Evaluation  Patient identified by MRN, date of birth, ID band Patient awake    Reviewed: Allergy & Precautions, H&P , NPO status , Patient's Chart, lab work & pertinent test results, reviewed documented beta blocker date and time   History of Anesthesia Complications (+) PONV and AWARENESS UNDER ANESTHESIA  Airway Mallampati: II TM Distance: >3 FB Neck ROM: Full    Dental  (+) Teeth Intact and Dental Advisory Given   Pulmonary neg pulmonary ROS,  breath sounds clear to auscultation        Cardiovascular negative cardio ROS  Rhythm:Regular Rate:Normal  Denies cardiac symptoms   Neuro/Psych negative neurological ROS  negative psych ROS   GI/Hepatic Jaundice w/ gallstone Common duct stone   Endo/Other  negative endocrine ROS  Renal/GU negative Renal ROS  negative genitourinary   Musculoskeletal negative musculoskeletal ROS (+)   Abdominal   Peds negative pediatric ROS (+)  Hematology negative hematology ROS (+)   Anesthesia Other Findings   Reproductive/Obstetrics negative OB ROS                          Anesthesia Physical Anesthesia Plan  ASA: II  Anesthesia Plan: General   Post-op Pain Management:    Induction: Intravenous  Airway Management Planned: Oral ETT  Additional Equipment:   Intra-op Plan:   Post-operative Plan: Extubation in OR  Informed Consent: I have reviewed the patients History and Physical, chart, labs and discussed the procedure including the risks, benefits and alternatives for the proposed anesthesia with the patient or authorized representative who has indicated his/her understanding and acceptance.   Dental advisory given  Plan Discussed with: CRNA and Surgeon  Anesthesia Plan Comments:         Anesthesia Quick Evaluation

## 2011-10-28 NOTE — Progress Notes (Signed)
[name] 1:48 PM   Subjective: Patient is currently asymptomatic and her history was reviewed and the chart was reviewed and I discussed her case with my partners Dr. Evette Cristal and Dr. Bosie Clos as well as the patient and her husband and her previous procedure was reviewed  Objective: Vital signs stable afebrile no acute distress exam please see preop evaluation  Assessment: Cystic duct or CBD stone  Plan: Okay to proceed with ERCP ASAP and the procedure was rediscussed with the patient and her husband including the risks  [facility]

## 2011-10-28 NOTE — Progress Notes (Signed)
3 Days Post-Op  Subjective: No complaints  Objective: Vital signs in last 24 hours: Temp:  [98.2 F (36.8 C)-99 F (37.2 C)] 98.2 F (36.8 C) (04/16 0550) Pulse Rate:  [70-80] 70  (04/16 0550) Resp:  [16-18] 16  (04/16 0550) BP: (100-124)/(62-85) 100/62 mmHg (04/16 0550) SpO2:  [96 %-99 %] 97 % (04/16 0550) Last BM Date: 10/24/11  Intake/Output from previous day: 04/15 0701 - 04/16 0700 In: 2602.3 [P.O.:480; I.V.:2122.3] Out: -  Intake/Output this shift: Total I/O In: 993.3 [P.O.:240; I.V.:753.3] Out: -   wounds clean dry non tender abdomen  Lab Results:   Basename 10/27/11 0406  WBC 5.1  HGB 10.9*  HCT 34.2*  PLT 260   BMET  Basename 10/27/11 0406  NA 135  K 3.6  CL 99  CO2 28  GLUCOSE 97  BUN 3*  CREATININE 0.70  CALCIUM 8.8   PT/INR No results found for this basename: LABPROT:2,INR:2 in the last 72 hours ABG No results found for this basename: PHART:2,PCO2:2,PO2:2,HCO3:2 in the last 72 hours  Studies/Results: No results found.  Anti-infectives: Anti-infectives     Start     Dose/Rate Route Frequency Ordered Stop   10/25/11 1030   ampicillin-sulbactam (UNASYN) 1.5 g in sodium chloride 0.9 % 50 mL IVPB        1.5 g 100 mL/hr over 30 Minutes Intravenous  Once 10/25/11 1010 10/25/11 1046          Assessment/Plan:  choledocolithiasis s/p Procedure(s) (LRB): ENDOSCOPIC RETROGRADE CHOLANGIOPANCREATOGRAPHY (ERCP) (N/A) Repeat ERCP today. If unsuccessful options include common duct exploration,  Transfer to tertiary care or PTC.  LOS: 4 days    Mahdiya Mossberg A. 10/28/2011

## 2011-10-28 NOTE — Transfer of Care (Signed)
Immediate Anesthesia Transfer of Care Note  Patient: [name]  Procedure(s) Performed: Procedure(s) (LRB): ENDOSCOPIC RETROGRADE CHOLANGIOPANCREATOGRAPHY (ERCP) (N/A)  Patient Location: PACU  Anesthesia Type: General  Level of Consciousness: awake, alert , oriented, patient cooperative and responds to stimulation  Airway & Oxygen Therapy: Patient Spontanous Breathing and Patient connected to face mask oxygen  Post-op Assessment: Report given to PACU RN, Post -op Vital signs reviewed and stable and Patient moving all extremities X 4  Post vital signs: stable  Complications: No apparent anesthesia complications

## 2011-10-28 NOTE — Preoperative (Signed)
Beta Blockers   Reason not to administer Beta Blockers:Not Applicable 

## 2011-10-29 ENCOUNTER — Encounter (HOSPITAL_COMMUNITY): Payer: Self-pay | Admitting: Gastroenterology

## 2011-10-29 LAB — CBC
HCT: 36.3 % (ref 36.0–46.0)
MCHC: 32.2 g/dL (ref 30.0–36.0)
MCV: 93.8 fL (ref 78.0–100.0)
RDW: 13.7 % (ref 11.5–15.5)

## 2011-10-29 LAB — DIFFERENTIAL
Basophils Absolute: 0 10*3/uL (ref 0.0–0.1)
Basophils Relative: 0 % (ref 0–1)
Eosinophils Absolute: 0 10*3/uL (ref 0.0–0.7)
Eosinophils Relative: 0 % (ref 0–5)
Monocytes Absolute: 0.3 10*3/uL (ref 0.1–1.0)

## 2011-10-29 LAB — COMPREHENSIVE METABOLIC PANEL
AST: 155 U/L — ABNORMAL HIGH (ref 0–37)
Albumin: 3.3 g/dL — ABNORMAL LOW (ref 3.5–5.2)
Calcium: 9.2 mg/dL (ref 8.4–10.5)
Creatinine, Ser: 0.67 mg/dL (ref 0.50–1.10)
GFR calc non Af Amer: >90 mL/min (ref >90)

## 2011-10-29 MED ORDER — FENTANYL CITRATE 0.05 MG/ML IJ SOLN: 25.0000 ug | INTRAMUSCULAR | Status: DC | PRN

## 2011-10-29 NOTE — Progress Notes (Signed)
1 Day Post-Op  Subjective: Feels well.    Objective: Vital signs in last 24 hours: Temp:  [97.9 F (36.6 C)-98.7 F (37.1 C)] 98.4 F (36.9 C) (04/17 0430) Pulse Rate:  [52-99] 52  (04/17 0430) Resp:  [12-18] 18  (04/17 0430) BP: (101-137)/(67-85) 101/67 mmHg (04/17 0430) SpO2:  [95 %-100 %] 97 % (04/17 0430) Last BM Date: 10/27/11  Intake/Output from previous day: 04/16 0701 - 04/17 0700 In: 2538 [I.V.:2538] Out: -  Intake/Output this shift:    soft non tender abdomen  Lab Results:   Basename 10/29/11 0409 10/27/11 0406  WBC 5.6 5.1  HGB 11.7* 10.9*  HCT 36.3 34.2*  PLT 269 260   BMET  Basename 10/29/11 0409 10/27/11 0406  NA 138 135  K 4.0 3.6  CL 101 99  CO2 28 28  GLUCOSE 144* 97  BUN 4* 3*  CREATININE 0.67 0.70  CALCIUM 9.2 8.8   PT/INR No results found for this basename: LABPROT:2,INR:2 in the last 72 hours ABG No results found for this basename: PHART:2,PCO2:2,PO2:2,HCO3:2 in the last 72 hours  Studies/Results: Dg Ercp With Sphincterotomy  10/28/2011  *RADIOLOGY REPORT*  Clinical Data: Possible common bile duct stones  ERCP  Comparison:  CT abdomen and pelvis 10/24/2011.  Technique:  Multiple spot images obtained with the fluoroscopic device and submitted for interpretation post-procedure.  ERCP was performed by Dr. Ewing Schlein.  Findings: We are provided with six fluoroscopic spot views from ERCP.  Images demonstrate cannulation of the common bile duct.  The duct is irregular with multiple filling defects present consistent with presence of stones.  Balloon sweep and sphincterotomy were performed.  Subsequent injection of contrast demonstrates no evidence of retained stone.  The patient is status post cholecystectomy.  IMPRESSION: ERCP as above.  These images were submitted for radiologic interpretation only. Please see the procedural report for the amount of contrast and the fluoroscopy time utilized.  Original Report Authenticated By: Bernadene Bell. Maricela Curet, M.D.      Anti-infectives: Anti-infectives     Start     Dose/Rate Route Frequency Ordered Stop   10/28/11 1230   ampicillin-sulbactam (UNASYN) 1.5 g in sodium chloride 0.9 % 50 mL IVPB        1.5 g 100 mL/hr over 30 Minutes Intravenous  Once 10/28/11 1215 10/28/11 1254   10/28/11 1230   ampicillin-sulbactam (UNASYN) 1.5 g in sodium chloride 0.9 % 50 mL IVPB  Status:  Discontinued        1.5 g 100 mL/hr over 30 Minutes Intravenous  Once 10/28/11 1215 10/28/11 1222   10/25/11 1030   ampicillin-sulbactam (UNASYN) 1.5 g in sodium chloride 0.9 % 50 mL IVPB        1.5 g 100 mL/hr over 30 Minutes Intravenous  Once 10/25/11 1010 10/25/11 1046          Assessment/Plan: s/p Procedure(s) (LRB):  Looks good.  LFT improving. ENDOSCOPIC RETROGRADE CHOLANGIOPANCREATOGRAPHY (ERCP) (N/A) Discharge  LOS: 5 days    Lilas Diefendorf A. 10/29/2011

## 2011-10-29 NOTE — Progress Notes (Signed)
[facility] PROGRESS NOTE Subjective Tolerating diet  Objective: Vital signs in last 24 hours: Temp:  [97.9 F (36.6 C)-98.7 F (37.1 C)] 98.4 F (36.9 C) (04/17 0430) Pulse Rate:  [52-99] 52  (04/17 0430) Resp:  [12-18] 18  (04/17 0430) BP: (101-137)/(67-85) 101/67 mmHg (04/17 0430) SpO2:  [95 %-100 %] 97 % (04/17 0430) Last BM Date: 10/27/11  Intake/Output from previous day: 04/16 0701 - 04/17 0700 In: 2538 [I.V.:2538] Out: -  Intake/Output this shift:    PE:Abd- soft and nontender  Lab Results:  Basename 10/29/11 0409 10/27/11 0406  WBC 5.6 5.1  HGB 11.7* 10.9*  HCT 36.3 34.2*  PLT 269 260   BMET  Basename 10/29/11 0409 10/27/11 0406  NA 138 135  K 4.0 3.6  CL 101 99  CO2 28 28  CREATININE 0.67 0.70   LFT  Basename 10/29/11 0409 10/28/11 0029 10/27/11 0406  PROT 6.7 7.1 6.4  AST 155* 225* 249*  ALT 434* 547* 578*  ALKPHOS 242* 233* 201*  BILITOT 3.1* 5.7* 4.8*  BILIDIR -- 4.4* --  IBILI -- 1.3* --   PT/INR No results found for this basename: LABPROT:3,INR:3 in the last 72 hours PANCREAS No results found for this basename: LIPASE:3 in the last 72 hours       Studies/Results: Dg Ercp With Sphincterotomy  10/28/2011  *RADIOLOGY REPORT*  Clinical Data: Possible common bile duct stones  ERCP  Comparison:  CT abdomen and pelvis 10/24/2011.  Technique:  Multiple spot images obtained with the fluoroscopic device and submitted for interpretation post-procedure.  ERCP was performed by Dr. Ewing Schlein.  Findings: We are provided with six fluoroscopic spot views from ERCP.  Images demonstrate cannulation of the common bile duct.  The duct is irregular with multiple filling defects present consistent with presence of stones.  Balloon sweep and sphincterotomy were performed.  Subsequent injection of contrast demonstrates no evidence of retained stone.  The patient is status post cholecystectomy.  IMPRESSION: ERCP as above.  These images were submitted for  radiologic interpretation only. Please see the procedural report for the amount of contrast and the fluoroscopy time utilized.  Original Report Authenticated By: Bernadene Bell. Maricela Curet, M.D.    Medications: I have reviewed the patient's current medications.  Assessment/Plan: 1. CBD stone s/p ERCP and stone removal. Doing well.  OK for discharge. Asked her to see Dr Ewing Schlein in about 2 weeks to make sure that the LFTs have returned to normal   Muad Noga JR,Starr Urias L 10/29/2011, 8:38 AM

## 2011-10-29 NOTE — Discharge Summary (Signed)
Physician Discharge Summary  Patient ID: [name] MRN: [id number] DOB/AGE: Jul 30, 1970 41 y.o.  Admit date: 10/24/2011 Discharge date: 10/29/2011  Admission Diagnoses: obstructive jaundice  Discharge Diagnoses: choledocholithiasis  Principal Problem:  *Obstructive jaundice likely due to choledocholithiasis   Discharged Condition: good  Hospital Course: Pt admitted for elevated liver enzymes.  CT showed distal CBD obstruction.  [facility] consulted.  Initial ERCP not successful but repeat ERCP and sphincterototmy successful ad duct dilation and removal of stones.  LFT improved post procedure day 1.  Tolerating diet without significant pain   Vitals normal.  Discharge.  Consults: GI  Significant Diagnostic Studies: labs: cbc and LFT. and endoscopy: ERCP: TIMES 2.  Sphincterotomy and stone extraction.  Treatments: IV hydration, analgesia: Morphine and ERCP  Discharge Exam: Blood pressure 101/67, pulse 52, temperature 98.4 F (36.9 C), temperature source Oral, resp. rate 18, height 5\' 3"  (1.6 m), weight 140 lb 8 oz (63.73 kg), last menstrual period 10/23/2011, SpO2 97.00%. General appearance: alert and cooperative ABDOMEN SOFT NONTENDER  Disposition: 01-Home or Self Care  Discharge Orders    Future Orders Please Complete By Expires   Diet - low sodium heart healthy      Increase activity slowly      Discharge instructions      Comments:   Resume regular activity as tolerated.  Low fat diet.     Medication List  As of 10/29/2011  7:51 AM   TAKE these medications         acetaminophen 500 MG tablet   Commonly known as: TYLENOL   Take 1,000 mg by mouth every 6 (six) hours as needed. For pain      oxyCODONE-acetaminophen 5-325 MG per tablet   Commonly known as: PERCOCET   Take 1-2 tablets by mouth every 6 (six) hours as needed.           Follow-up Information    Follow up with Eunique Balik A., MD. Schedule an appointment as soon as possible for a visit in 3  weeks.   Contact information:   [employer] [address] [place] [address] [phone]          Signed: Dortha Schwalbe. 10/29/2011, 7:51 AM

## 2011-11-17 ENCOUNTER — Ambulatory Visit (INDEPENDENT_AMBULATORY_CARE_PROVIDER_SITE_OTHER): Payer: Managed Care, Other (non HMO) | Admitting: Surgery

## 2011-11-17 ENCOUNTER — Encounter (INDEPENDENT_AMBULATORY_CARE_PROVIDER_SITE_OTHER): Payer: Self-pay | Admitting: Surgery

## 2011-11-17 VITALS — BP 144/82 | HR 82 | Temp 98.2°F | Resp 16 | Ht 63.0 in | Wt 145.2 lb

## 2011-11-17 DIAGNOSIS — Z9889 Other specified postprocedural states: Secondary | ICD-10-CM

## 2011-11-17 NOTE — Patient Instructions (Signed)
Return as needed

## 2011-11-17 NOTE — Progress Notes (Signed)
Patient returns after laparoscopic cholecystectomy. She is readmitted for ERCP due to a common duct stone. She is doing well. Her liver functions and was returned to normal.  Exam: Abdomen soft nontender. Incisions well healed. No signs of jaundice.  LFTs reviewed today and these are at baseline.  Impression: Status post laparoscopic cholecystectomy postop ERCP  Plan: Doing well return as needed.

## 2012-08-22 ENCOUNTER — Encounter (HOSPITAL_COMMUNITY): Payer: Self-pay | Admitting: Emergency Medicine

## 2012-08-22 ENCOUNTER — Emergency Department (HOSPITAL_COMMUNITY): Payer: Managed Care, Other (non HMO)

## 2012-08-22 ENCOUNTER — Inpatient Hospital Stay (HOSPITAL_COMMUNITY)
Admission: EM | Admit: 2012-08-22 | Discharge: 2012-08-24 | DRG: 446 | Disposition: A | Discharge status: Home / Self Care | Payer: Managed Care, Other (non HMO) | Attending: Gastroenterology | Admitting: Gastroenterology

## 2012-08-22 DIAGNOSIS — K9186 Retained cholelithiasis following cholecystectomy: Secondary | ICD-10-CM

## 2012-08-22 DIAGNOSIS — Z9089 Acquired absence of other organs: Secondary | ICD-10-CM

## 2012-08-22 DIAGNOSIS — R109 Unspecified abdominal pain: Secondary | ICD-10-CM

## 2012-08-22 DIAGNOSIS — K831 Obstruction of bile duct: Secondary | ICD-10-CM | POA: Diagnosis present

## 2012-08-22 DIAGNOSIS — K8051 Calculus of bile duct without cholangitis or cholecystitis with obstruction: Principal | ICD-10-CM | POA: Diagnosis present

## 2012-08-22 DIAGNOSIS — R1013 Epigastric pain: Secondary | ICD-10-CM

## 2012-08-22 LAB — COMPREHENSIVE METABOLIC PANEL
AST: 108 U/L — ABNORMAL HIGH (ref 0–37)
Albumin: 3.9 g/dL (ref 3.5–5.2)
Alkaline Phosphatase: 73 U/L (ref 39–117)
BUN: 15 mg/dL (ref 6–23)
Chloride: 105 mEq/L (ref 96–112)
Potassium: 4.2 mEq/L (ref 3.5–5.1)
Total Bilirubin: 0.9 mg/dL (ref 0.3–1.2)

## 2012-08-22 LAB — CBC WITH DIFFERENTIAL/PLATELET
Basophils Absolute: 0 10*3/uL (ref 0.0–0.1)
Basophils Relative: 0 % (ref 0–1)
Hemoglobin: 12.7 g/dL (ref 12.0–15.0)
Lymphocytes Relative: 17 % (ref 12–46)
MCHC: 33.5 g/dL (ref 30.0–36.0)
Monocytes Relative: 6 % (ref 3–12)
Neutro Abs: 6.1 10*3/uL (ref 1.7–7.7)
Neutrophils Relative %: 76 % (ref 43–77)
WBC: 8.1 10*3/uL (ref 4.0–10.5)

## 2012-08-22 LAB — URINALYSIS, ROUTINE W REFLEX MICROSCOPIC
Bilirubin Urine: NEGATIVE
Glucose, UA: NEGATIVE mg/dL
Ketones, ur: NEGATIVE mg/dL
pH: 7 (ref 5.0–8.0)

## 2012-08-22 MED ORDER — ALUM & MAG HYDROXIDE-SIMETH 200-200-20 MG/5ML PO SUSP: 30.0000 mL | Freq: Four times a day (QID) | ORAL | Status: DC | PRN

## 2012-08-22 MED ORDER — ACETAMINOPHEN 325 MG PO TABS: 650.0000 mg | ORAL_TABLET | Freq: Four times a day (QID) | ORAL | Status: DC | PRN

## 2012-08-22 MED ORDER — KCL IN DEXTROSE-NACL 20-5-0.45 MEQ/L-%-% IV SOLN
INTRAVENOUS | Status: DC
  Administered 2012-08-22 – 2012-08-23 (×3): via INTRAVENOUS
  Filled 2012-08-22 (×9): qty 1000

## 2012-08-22 MED ORDER — HYDROCODONE-ACETAMINOPHEN 5-325 MG PO TABS: 1.0000 | ORAL_TABLET | ORAL | Status: DC | PRN

## 2012-08-22 MED ORDER — ACETAMINOPHEN 650 MG RE SUPP: 650.0000 mg | Freq: Four times a day (QID) | RECTAL | Status: DC | PRN

## 2012-08-22 MED ORDER — ESCITALOPRAM OXALATE 5 MG PO TABS
15.0000 mg | ORAL_TABLET | Freq: Every day | ORAL | Status: DC
  Administered 2012-08-22 – 2012-08-23 (×2): 15 mg via ORAL
  Filled 2012-08-22: qty 2
  Filled 2012-08-22 (×2): qty 1

## 2012-08-22 MED ORDER — HYDROMORPHONE HCL PF 1 MG/ML IJ SOLN: 1.0000 mg | INTRAMUSCULAR | Status: DC | PRN

## 2012-08-22 MED ORDER — PROMETHAZINE HCL 25 MG PO TABS
12.5000 mg | ORAL_TABLET | Freq: Four times a day (QID) | ORAL | Status: DC | PRN
  Administered 2012-08-22: 12.5 mg via ORAL
  Filled 2012-08-22 (×2): qty 1

## 2012-08-22 MED ORDER — MORPHINE SULFATE 4 MG/ML IJ SOLN
4.0000 mg | Freq: Once | INTRAMUSCULAR | Status: AC
  Administered 2012-08-22: 4 mg via INTRAVENOUS
  Filled 2012-08-22: qty 1

## 2012-08-22 MED ORDER — ZOLPIDEM TARTRATE 5 MG PO TABS: 5.0000 mg | ORAL_TABLET | Freq: Every evening | ORAL | Status: DC | PRN

## 2012-08-22 NOTE — ED Provider Notes (Signed)
History     CSN: [id number]  Arrival date & time 08/22/12  1206   First MD Initiated Contact with Patient 08/22/12 1400      Chief Complaint  Patient presents with  . Abdominal Pain    (Consider location/radiation/quality/duration/timing/severity/associated sxs/prior treatment) HPI Comments: Patient with history of Lap Chole and ERCP for retained stone in April of last year.  Presents today with sudden onset of epigastric pain that feels similar to her pain with the gallstones.  She felt nauseated and the pain was significant, but seems to be improving now.  No fevers or chills.  No vomiting or diarrhea.  Patient is a 42 y.o. female presenting with abdominal pain. The history is provided by the patient.  Abdominal Pain Pain location:  Epigastric Pain quality: sharp   Pain radiates to:  Does not radiate Pain severity:  Moderate Onset quality:  Sudden Duration:  2 hours Timing:  Constant Progression:  Partially resolved Chronicity:  Recurrent Relieved by:  Nothing   Past Medical History  Diagnosis Date  . Headache 10-06-11    hx. migraines occ.  Mer[place] Ray stones   . PONV (postoperative nausea and vomiting)     Past Surgical History  Procedure Laterality Date  . Tummy   10-06-11    11-'11Tummy Tuck  . Cesarean section  10-06-11    '04/ '05  . Appendectomy    . Tubal ligation    . Cholecystectomy  10/10/2011    Procedure: LAPAROSCOPIC CHOLECYSTECTOMY WITH INTRAOPERATIVE CHOLANGIOGRAM;  Surgeon: Clovis Pu. Cornett, MD;  Location: [facility];  Service: General;  Laterality: N/A;  Laparoscopic Cholecystectomy and Cholangiogram  . Ercp  10/25/2011    Procedure: ENDOSCOPIC RETROGRADE CHOLANGIOPANCREATOGRAPHY (ERCP);  Surgeon: Graylin Shiver, MD;  Location: [facility];  Service: Endoscopy;  Laterality: N/A;  dr Evette Cristal would like to do this case @ 1100 or earlier , but not later.. Call nurse  Pls up date with time  . Ercp  10/28/2011    Procedure: ENDOSCOPIC RETROGRADE  CHOLANGIOPANCREATOGRAPHY (ERCP);  Surgeon: Petra Kuba, MD;  Location: [facility];  Service: Endoscopy;  Laterality: N/A;  with stone extraction    Family History  Problem Relation Age of Onset  . Colon cancer Mother   . Cancer Mother     colon  . Lung cancer Maternal Grandmother   . Lung cancer Maternal Grandfather     History  Substance Use Topics  . Smoking status: Never Smoker   . Smokeless tobacco: Never Used  . Alcohol Use: No    OB History   Grav Para Term Preterm Abortions TAB SAB Ect Mult Living                  Review of Systems  Gastrointestinal: Positive for abdominal pain.  All other systems reviewed and are negative.    Allergies  Review of patient's allergies indicates no known allergies.  Home Medications   Current Outpatient Rx  Name  Route  Sig  Dispense  Refill  . escitalopram (LEXAPRO) 10 MG tablet   Oral   Take 15 mg by mouth daily.         . nitrofurantoin, macrocrystal-monohydrate, (MACROBID) 100 MG capsule   Oral   Take 100 mg by mouth daily.           BP 117/78  Pulse 78  Temp(Src) 97.8 F (36.6 C) (Oral)  Resp 18  SpO2 98%  Physical Exam  Nursing note and vitals reviewed. Constitutional: She is  oriented to person, place, and time. She appears well-developed and well-nourished. No distress.  HENT:  Head: Normocephalic and atraumatic.  Neck: Normal range of motion. Neck supple.  Cardiovascular: Normal rate and regular rhythm.  Exam reveals no gallop and no friction rub.   No murmur heard. Pulmonary/Chest: Effort normal and breath sounds normal. No respiratory distress. She has no wheezes.  Abdominal: Soft. Bowel sounds are normal. She exhibits no distension.  There is mild ttp in the epigastric region without rebound or guarding.    Musculoskeletal: Normal range of motion.  Neurological: She is alert and oriented to person, place, and time.  Skin: Skin is warm and dry. She is not diaphoretic.    ED Course   Procedures (including critical care time)  Labs Reviewed  COMPREHENSIVE METABOLIC PANEL - Abnormal; Notable for the following:    AST 108 (*)    ALT 42 (*)    All other components within normal limits  CBC WITH DIFFERENTIAL  LIPASE, BLOOD  URINALYSIS, ROUTINE W REFLEX MICROSCOPIC   No results found.   No diagnosis found.    MDM  The patient presents with epigastric pain and has a history of lap chole with retained gallstones requiring ERCP.  The lfts are mildly elevated but she continues with significant pain.  I have consulted Dr. Madilyn Fireman from GI who will see the patient in the ED.        Geoffery Lyons, MD 08/23/12 1420

## 2012-08-22 NOTE — ED Notes (Signed)
Pt reports sudden onset mid epigastric pain that radiates to back. Pain started 1 hour ago.  Pt has history of gallbladder/appendix removal April 2013.  Pt states gallstones were left in duct and needed to be removed surgically one month later.  Pt states pain 4/10 but occasionally increases to 10/10.  Pt alert oriented X4 not requesting pain meds at this time.  Denies N/V

## 2012-08-22 NOTE — ED Notes (Signed)
Family at bedside. 

## 2012-08-22 NOTE — ED Notes (Signed)
Pt c/o upper abd pain that feels like hx of gallstones in past x 30 min; pt sts pain through to back

## 2012-08-22 NOTE — H&P (Signed)
[facility] Admission History & Physical  Chief Complaint: epigastric pain HPI: [name] is an 42 y.o. white  female.  With a history of cholecystectomy followed by ERCP for retained common bile duct stones 1 year ago was in her usual good state of health until she experienced boring epigastric abdominal pain pre-cholecystectomy pain although even more severe. radiating to the back similar to herpre-cholecystectomy pain although even more severe. She drove to the emergency room and was evaluated and had slightly elevated AST and ALT with normal lipase bilirubin and alkaline phosphatase. Her ultrasound showed absence of gallbladder and a 9.6 mm common bile duct. Her pain is only slightly relieved by morphine. She is admitted for suspected retained or daily developed common bile duct stone and possible ERCP.  Past Medical History  Diagnosis Date  . Headache 10-06-11    hx. migraines occ.  [place] Ray stones   . PONV (postoperative nausea and vomiting)     Past Surgical History  Procedure Laterality Date  . Tummy   10-06-11    11-'11Tummy Tuck  . Cesarean section  10-06-11    '04/ '05  . Appendectomy    . Tubal ligation    . Cholecystectomy  10/10/2011    Procedure: LAPAROSCOPIC CHOLECYSTECTOMY WITH INTRAOPERATIVE CHOLANGIOGRAM;  Surgeon: Clovis Pu. Cornett, MD;  Location: [facility];  Service: General;  Laterality: N/A;  Laparoscopic Cholecystectomy and Cholangiogram  . Ercp  10/25/2011    Procedure: ENDOSCOPIC RETROGRADE CHOLANGIOPANCREATOGRAPHY (ERCP);  Surgeon: Graylin Shiver, MD;  Location: [facility];  Service: Endoscopy;  Laterality: N/A;  dr Evette Cristal would like to do this case @ 1100 or earlier , but not later.. Call nurse  Pls up date with time  . Ercp  10/28/2011    Procedure: ENDOSCOPIC RETROGRADE CHOLANGIOPANCREATOGRAPHY (ERCP);  Surgeon: Petra Kuba, MD;  Location: [facility];  Service: Endoscopy;  Laterality: N/A;  with stone extraction     (Not in a hospital  admission)  Allergies: No Known Allergies  Family History  Problem Relation Age of Onset  . Colon cancer Mother   . Cancer Mother     colon  . Lung cancer Maternal Grandmother   . Lung cancer Maternal Grandfather     Social History:  reports that she has never smoked. She has never used smokeless tobacco. She reports that she does not drink alcohol or use illicit drugs.  Review of Systems: negative except as above   Blood pressure 117/78, pulse 78, temperature 97.8 F (36.6 C), temperature source Oral, resp. rate 18, SpO2 98.00%. Head: Normocephalic, without obvious abnormality, atraumatic Neck: no adenopathy, no carotid bruit, no JVD, supple, symmetrical, trachea midline and thyroid not enlarged, symmetric, no tenderness/mass/nodules Resp: clear to auscultation bilaterally Cardio: regular rate and rhythm, S1, S2 normal, no murmur, click, rub or gallop GI: abdomen soft slightly tender with normoactive bowel sounds. Tenderness is primarily in the epigastrium and right upper quadrant Extremities: extremities normal, atraumatic, no cyanosis or edema  Results for orders placed during the hospital encounter of 08/22/12 (from the past 48 hour(s))  CBC WITH DIFFERENTIAL     Status: None   Collection Time    08/22/12 12:17 PM      Result Value Range   WBC 8.1  4.0 - 10.5 K/uL   RBC 4.15  3.87 - 5.11 MIL/uL   Hemoglobin 12.7  12.0 - 15.0 g/dL   HCT 16.1  09.6 - 04.5 %   MCV 91.3  78.0 - 100.0 fL  MCH 30.6  26.0 - 34.0 pg   MCHC 33.5  30.0 - 36.0 g/dL   RDW 45.4  09.8 - 11.9 %   Platelets 216  150 - 400 K/uL   Neutrophils Relative 76  43 - 77 %   Neutro Abs 6.1  1.7 - 7.7 K/uL   Lymphocytes Relative 17  12 - 46 %   Lymphs Abs 1.4  0.7 - 4.0 K/uL   Monocytes Relative 6  3 - 12 %   Monocytes Absolute 0.5  0.1 - 1.0 K/uL   Eosinophils Relative 1  0 - 5 %   Eosinophils Absolute 0.1  0.0 - 0.7 K/uL   Basophils Relative 0  0 - 1 %   Basophils Absolute 0.0  0.0 - 0.1 K/uL   COMPREHENSIVE METABOLIC PANEL     Status: Abnormal   Collection Time    08/22/12 12:17 PM      Result Value Range   Sodium 138  135 - 145 mEq/L   Potassium 4.2  3.5 - 5.1 mEq/L   Comment: HEMOLYSIS AT THIS LEVEL MAY AFFECT RESULT   Chloride 105  96 - 112 mEq/L   CO2 23  19 - 32 mEq/L   Glucose, Bld 82  70 - 99 mg/dL   BUN 15  6 - 23 mg/dL   Creatinine, Ser 1.47  0.50 - 1.10 mg/dL   Calcium 8.8  8.4 - 82.9 mg/dL   Total Protein 7.4  6.0 - 8.3 g/dL   Albumin 3.9  3.5 - 5.2 g/dL   AST 562 (*) 0 - 37 U/L   ALT 42 (*) 0 - 35 U/L   Alkaline Phosphatase 73  39 - 117 U/L   Total Bilirubin 0.9  0.3 - 1.2 mg/dL   GFR calc non Af Amer >90  >90 mL/min   GFR calc Af Amer >90  >90 mL/min   Comment:            The eGFR has been calculated     using the CKD EPI equation.     This calculation has not been     validated in all clinical     situations.     eGFR's persistently     <90 mL/min signify     possible Chronic Kidney Disease.  LIPASE, BLOOD     Status: None   Collection Time    08/22/12 12:17 PM      Result Value Range   Lipase 31  11 - 59 U/L  URINALYSIS, ROUTINE W REFLEX MICROSCOPIC     Status: None   Collection Time    08/22/12  1:53 PM      Result Value Range   Color, Urine YELLOW  YELLOW   APPearance CLEAR  CLEAR   Specific Gravity, Urine 1.023  1.005 - 1.030   pH 7.0  5.0 - 8.0   Glucose, UA NEGATIVE  NEGATIVE mg/dL   Hgb urine dipstick NEGATIVE  NEGATIVE   Bilirubin Urine NEGATIVE  NEGATIVE   Ketones, ur NEGATIVE  NEGATIVE mg/dL   Protein, ur NEGATIVE  NEGATIVE mg/dL   Urobilinogen, UA 1.0  0.0 - 1.0 mg/dL   Nitrite NEGATIVE  NEGATIVE   Leukocytes, UA NEGATIVE  NEGATIVE   Comment: MICROSCOPIC NOT DONE ON URINES WITH NEGATIVE PROTEIN, BLOOD, LEUKOCYTES, NITRITE, OR GLUCOSE <1000 mg/dL.   US Abdomen Complete  08/22/2012  *RADIOLOGY REPORT*  Clinical Data:  History of gallstones.  Status post cholecystectomy.  COMPLETE ABDOMINAL ULTRASOUND  Comparison:  CT abdomen  10/24/2011  Findings:  Gallbladder:  Surgically absent  Common bile duct:  Dilated  to 9.7 mm.  Liver:  No intrahepatic biliary ductal dilatation is identified. Normal echogenicity.  IVC:  Appears normal.  Pancreas:  No focal abnormality seen.  Spleen:  Normal in size and echogenicity.  Right Kidney:  11.1cm in length.  No evidence of hydronephrosis or stones.  Left Kidney:  11.3cm in length.  No evidence of hydronephrosis or stones.  Abdominal aorta:  No aneurysm identified.  IMPRESSION:  1.  Dilatation the common bile duct without intrahepatic biliary duct dilatation.  Multiple stones on comparison ERCP from 10/28/2011.  Cannot exclude retained stone.  Recommend correlation with LFTs.  2.  Cholecystectomy   Original Report Authenticated By: Genevive Bi, M.D.     Assessment: Biliary colic-type pain in a patient with a history of gallstones status post cholecystectomy and common bile duct stone status post ERCP with removal. There was also a question of a distal common bile duct stricture which was dilated time of her original ERCP. Plan: Will admit, recheck liver function tests in the morning as well as lipase and also monitor her pain level. If LFTs rise significantly or pain not improved we'll probably need ERCP possibly preceded by endoscopic ultrasound. If doing much better, and liver function test normalize , consider MRCP and expectant management if negative. Keliah Harned C 08/22/2012, 4:36 PM

## 2012-08-23 ENCOUNTER — Encounter (HOSPITAL_COMMUNITY): Payer: Self-pay | Admitting: *Deleted

## 2012-08-23 ENCOUNTER — Encounter (HOSPITAL_COMMUNITY)
Admission: EM | Disposition: A | Discharge status: Home / Self Care | Payer: Self-pay | Source: Home / Self Care | Attending: Gastroenterology

## 2012-08-23 ENCOUNTER — Encounter (HOSPITAL_COMMUNITY): Payer: Self-pay | Admitting: Certified Registered Nurse Anesthetist

## 2012-08-23 ENCOUNTER — Inpatient Hospital Stay (HOSPITAL_COMMUNITY): Payer: Managed Care, Other (non HMO)

## 2012-08-23 ENCOUNTER — Inpatient Hospital Stay (HOSPITAL_COMMUNITY): Payer: Managed Care, Other (non HMO) | Admitting: Certified Registered Nurse Anesthetist

## 2012-08-23 HISTORY — PX: ERCP: SHX5425

## 2012-08-23 LAB — COMPREHENSIVE METABOLIC PANEL
ALT: 541 U/L — ABNORMAL HIGH (ref 0–35)
BUN: 5 mg/dL — ABNORMAL LOW (ref 6–23)
CO2: 24 mEq/L (ref 19–32)
Calcium: 8.4 mg/dL (ref 8.4–10.5)
Creatinine, Ser: 0.7 mg/dL (ref 0.50–1.10)
GFR calc Af Amer: >90 mL/min (ref >90)
GFR calc non Af Amer: >90 mL/min (ref >90)
Glucose, Bld: 142 mg/dL — ABNORMAL HIGH (ref 70–99)
Sodium: 138 mEq/L (ref 135–145)

## 2012-08-23 LAB — LIPASE, BLOOD: Lipase: 14 U/L (ref 11–59)

## 2012-08-23 SURGERY — ERCP, WITH INTERVENTION IF INDICATED
Anesthesia: General

## 2012-08-23 MED ORDER — SODIUM CHLORIDE 0.9 % IJ SOLN
INTRAMUSCULAR | Status: DC | PRN
  Administered 2012-08-23: 16:00:00

## 2012-08-23 MED ORDER — DIPHENHYDRAMINE HCL 50 MG/ML IJ SOLN
INTRAMUSCULAR | Status: DC | PRN
  Administered 2012-08-23: 12.5 mg via INTRAVENOUS

## 2012-08-23 MED ORDER — PROPOFOL 10 MG/ML IV BOLUS
INTRAVENOUS | Status: DC | PRN
  Administered 2012-08-23: 150 mg via INTRAVENOUS

## 2012-08-23 MED ORDER — LIDOCAINE HCL (CARDIAC) 20 MG/ML IV SOLN
INTRAVENOUS | Status: DC | PRN
  Administered 2012-08-23: 70 mg via INTRAVENOUS

## 2012-08-23 MED ORDER — ONDANSETRON HCL 4 MG/2ML IJ SOLN
INTRAMUSCULAR | Status: DC | PRN
  Administered 2012-08-23: 4 mg via INTRAVENOUS

## 2012-08-23 MED ORDER — HYDROMORPHONE HCL PF 1 MG/ML IJ SOLN: 0.2500 mg | INTRAMUSCULAR | Status: DC | PRN

## 2012-08-23 MED ORDER — SODIUM CHLORIDE 0.9 % IV SOLN: INTRAVENOUS | Status: DC

## 2012-08-23 MED ORDER — ROCURONIUM BROMIDE 100 MG/10ML IV SOLN
INTRAVENOUS | Status: DC | PRN
  Administered 2012-08-23: 25 mg via INTRAVENOUS

## 2012-08-23 MED ORDER — SODIUM CHLORIDE 0.9 % IV SOLN
1.5000 g | Freq: Once | INTRAVENOUS | Status: AC
  Administered 2012-08-23: 1.5 g via INTRAVENOUS
  Filled 2012-08-23: qty 1.5

## 2012-08-23 MED ORDER — FENTANYL CITRATE 0.05 MG/ML IJ SOLN
INTRAMUSCULAR | Status: DC | PRN
  Administered 2012-08-23: 100 ug via INTRAVENOUS
  Administered 2012-08-23: 50 ug via INTRAVENOUS

## 2012-08-23 MED ORDER — DIPHENHYDRAMINE HCL 50 MG/ML IJ SOLN: 25.0000 mg | Freq: Four times a day (QID) | INTRAMUSCULAR | Status: DC | PRN

## 2012-08-23 MED ORDER — DIPHENHYDRAMINE HCL 50 MG/ML IJ SOLN
25.0000 mg | Freq: Four times a day (QID) | INTRAMUSCULAR | Status: DC | PRN
  Administered 2012-08-23: 25 mg via INTRAVENOUS
  Filled 2012-08-23: qty 1

## 2012-08-23 MED ORDER — LACTATED RINGERS IV SOLN
INTRAVENOUS | Status: DC | PRN
  Administered 2012-08-23: 15:00:00 via INTRAVENOUS

## 2012-08-23 MED ORDER — MIDAZOLAM HCL 5 MG/5ML IJ SOLN
INTRAMUSCULAR | Status: DC | PRN
  Administered 2012-08-23: 2 mg via INTRAVENOUS

## 2012-08-23 MED ORDER — DEXAMETHASONE SODIUM PHOSPHATE 4 MG/ML IJ SOLN
INTRAMUSCULAR | Status: DC | PRN
  Administered 2012-08-23: 4 mg via INTRAVENOUS

## 2012-08-23 NOTE — Preoperative (Signed)
Beta Blockers   Reason not to administer Beta Blockers:Not Applicable 

## 2012-08-23 NOTE — Anesthesia Preprocedure Evaluation (Addendum)
Anesthesia Evaluation  Patient identified by MRN, date of birth, ID band Patient awake    Reviewed: Allergy & Precautions, H&P , NPO status , Patient's Chart, lab work & pertinent test results  Airway Mallampati: II      Dental   Pulmonary neg pulmonary ROS,  breath sounds clear to auscultation        Cardiovascular negative cardio ROS  Rhythm:Regular Rate:Normal     Neuro/Psych    GI/Hepatic Neg liver ROS, History of gallstones.   Endo/Other  negative endocrine ROS  Renal/GU negative Renal ROS     Musculoskeletal   Abdominal   Peds  Hematology negative hematology ROS (+)   Anesthesia Other Findings   Reproductive/Obstetrics                           Anesthesia Physical Anesthesia Plan  ASA: II  Anesthesia Plan: General   Post-op Pain Management:    Induction: Intravenous  Airway Management Planned: Oral ETT  Additional Equipment:   Intra-op Plan:   Post-operative Plan: Extubation in OR  Informed Consent:   Dental advisory given  Plan Discussed with: CRNA, Surgeon and Anesthesiologist  Anesthesia Plan Comments:         Anesthesia Quick Evaluation

## 2012-08-23 NOTE — Progress Notes (Signed)
Addendum; liver function tests were markedly elevated this morning. ERCP was performed after rationale risks and alternatives discussed. Single 5 mm cuboid stone found in the common bile duct and removed after sphincterotomy. No pancreatic duct injection or wire advancement was done. There was good drainage in the negative cholangiogram at the end of the procedure. We'll watch overnight hopefully discharge in the morning if no complications

## 2012-08-23 NOTE — Anesthesia Postprocedure Evaluation (Signed)
  Anesthesia Post-op Note  Patient: [name]  Procedure(s) Performed: Procedure(s): ENDOSCOPIC RETROGRADE CHOLANGIOPANCREATOGRAPHY (ERCP) (N/A)  Patient Location: PACU  Anesthesia Type:General  Level of Consciousness: awake, alert , oriented and patient cooperative  Airway and Oxygen Therapy: Patient Spontanous Breathing and Patient connected to nasal cannula oxygen  Post-op Pain: none  Post-op Assessment: Post-op Vital signs reviewed, Patient's Cardiovascular Status Stable, Respiratory Function Stable, Patent Airway, No signs of Nausea or vomiting, Adequate PO intake and Pain level controlled  Post-op Vital Signs: Reviewed and stable  Complications: No apparent anesthesia complications

## 2012-08-23 NOTE — Transfer of Care (Signed)
Immediate Anesthesia Transfer of Care Note  Patient: [name]  Procedure(s) Performed: Procedure(s): ENDOSCOPIC RETROGRADE CHOLANGIOPANCREATOGRAPHY (ERCP) (N/A)  Patient Location: PACU  Anesthesia Type:General  Level of Consciousness: awake, alert  and oriented  Airway & Oxygen Therapy: Patient Spontanous Breathing and Patient connected to nasal cannula oxygen  Post-op Assessment: Report given to PACU RN, Post -op Vital signs reviewed and stable and Patient moving all extremities  Post vital signs: Reviewed and stable  Complications: No apparent anesthesia complications

## 2012-08-23 NOTE — Progress Notes (Signed)
[facility] Progress Note  Subjective: The patient had resolution of her pain and has not recurred yet  Objective: Vital signs in last 24 hours: Temp:  [97.8 F (36.6 C)-99 F (37.2 C)] 99 F (37.2 C) (02/10 0507) Pulse Rate:  [78-91] 85 (02/10 0507) Resp:  [18] 18 (02/10 0507) BP: (98-153)/(65-88) 111/78 mmHg (02/10 0507) SpO2:  [97 %-100 %] 98 % (02/10 0507) Weight:  [68.04 kg (150 lb)] 68.04 kg (150 lb) (02/09 1819) Weight change:    PE: Abdomen soft less tender  Lab Results: Results for orders placed during the hospital encounter of 08/22/12 (from the past 24 hour(s))  CBC WITH DIFFERENTIAL     Status: None   Collection Time    08/22/12 12:17 PM      Result Value Range   WBC 8.1  4.0 - 10.5 K/uL   RBC 4.15  3.87 - 5.11 MIL/uL   Hemoglobin 12.7  12.0 - 15.0 g/dL   HCT 21.3  08.6 - 57.8 %   MCV 91.3  78.0 - 100.0 fL   MCH 30.6  26.0 - 34.0 pg   MCHC 33.5  30.0 - 36.0 g/dL   RDW 46.9  62.9 - 52.8 %   Platelets 216  150 - 400 K/uL   Neutrophils Relative 76  43 - 77 %   Neutro Abs 6.1  1.7 - 7.7 K/uL   Lymphocytes Relative 17  12 - 46 %   Lymphs Abs 1.4  0.7 - 4.0 K/uL   Monocytes Relative 6  3 - 12 %   Monocytes Absolute 0.5  0.1 - 1.0 K/uL   Eosinophils Relative 1  0 - 5 %   Eosinophils Absolute 0.1  0.0 - 0.7 K/uL   Basophils Relative 0  0 - 1 %   Basophils Absolute 0.0  0.0 - 0.1 K/uL  COMPREHENSIVE METABOLIC PANEL     Status: Abnormal   Collection Time    08/22/12 12:17 PM      Result Value Range   Sodium 138  135 - 145 mEq/L   Potassium 4.2  3.5 - 5.1 mEq/L   Chloride 105  96 - 112 mEq/L   CO2 23  19 - 32 mEq/L   Glucose, Bld 82  70 - 99 mg/dL   BUN 15  6 - 23 mg/dL   Creatinine, Ser 4.13  0.50 - 1.10 mg/dL   Calcium 8.8  8.4 - 24.4 mg/dL   Total Protein 7.4  6.0 - 8.3 g/dL   Albumin 3.9  3.5 - 5.2 g/dL   AST 010 (*) 0 - 37 U/L   ALT 42 (*) 0 - 35 U/L   Alkaline Phosphatase 73  39 - 117 U/L   Total Bilirubin 0.9  0.3 - 1.2 mg/dL   GFR  calc non Af Amer >90  >90 mL/min   GFR calc Af Amer >90  >90 mL/min  LIPASE, BLOOD     Status: None   Collection Time    08/22/12 12:17 PM      Result Value Range   Lipase 31  11 - 59 U/L  URINALYSIS, ROUTINE W REFLEX MICROSCOPIC     Status: None   Collection Time    08/22/12  1:53 PM      Result Value Range   Color, Urine YELLOW  YELLOW   APPearance CLEAR  CLEAR   Specific Gravity, Urine 1.023  1.005 - 1.030   pH 7.0  5.0 - 8.0   Glucose,  UA NEGATIVE  NEGATIVE mg/dL   Hgb urine dipstick NEGATIVE  NEGATIVE   Bilirubin Urine NEGATIVE  NEGATIVE   Ketones, ur NEGATIVE  NEGATIVE mg/dL   Protein, ur NEGATIVE  NEGATIVE mg/dL   Urobilinogen, UA 1.0  0.0 - 1.0 mg/dL   Nitrite NEGATIVE  NEGATIVE   Leukocytes, UA NEGATIVE  NEGATIVE    Studies/Results: US Abdomen Complete  08/22/2012  *RADIOLOGY REPORT*  Clinical Data:  History of gallstones.  Status post cholecystectomy.  COMPLETE ABDOMINAL ULTRASOUND  Comparison:  CT abdomen 10/24/2011  Findings:  Gallbladder:  Surgically absent  Common bile duct:  Dilated  to 9.7 mm.  Liver:  No intrahepatic biliary ductal dilatation is identified. Normal echogenicity.  IVC:  Appears normal.  Pancreas:  No focal abnormality seen.  Spleen:  Normal in size and echogenicity.  Right Kidney:  11.1cm in length.  No evidence of hydronephrosis or stones.  Left Kidney:  11.3cm in length.  No evidence of hydronephrosis or stones.  Abdominal aorta:  No aneurysm identified.  IMPRESSION:  1.  Dilatation the common bile duct without intrahepatic biliary duct dilatation.  Multiple stones on comparison ERCP from 10/28/2011.  Cannot exclude retained stone.  Recommend correlation with LFTs.  2.  Cholecystectomy   Original Report Authenticated By: Genevive Bi, M.D.       Assessment: Suspicion of common bile duct stones with colicky pain yesterday which is resolved, minimally elevated LFTs, repeat studies negative  Plan: Continue clear liquid diet and await today's liver  function tests. Consider MRCP versus ERCP depending on her clinical course and liver function test elevations.    Mataeo Ingwersen C 08/23/2012, 8:27 AM

## 2012-08-23 NOTE — Anesthesia Procedure Notes (Signed)
Procedure Name: Intubation Date/Time: 08/23/2012 3:14 PM Performed by: Orvilla Fus A Pre-anesthesia Checklist: Patient identified, Timeout performed, Emergency Drugs available, Suction available and Patient being monitored Patient Re-evaluated:Patient Re-evaluated prior to inductionOxygen Delivery Method: Circle system utilized Preoxygenation: Pre-oxygenation with 100% oxygen Intubation Type: IV induction Ventilation: Mask ventilation without difficulty Grade View: Grade I Tube type: Oral Number of attempts: 1 Airway Equipment and Method: Stylet Placement Confirmation: ETT inserted through vocal cords under direct vision,  breath sounds checked- equal and bilateral and positive ETCO2 Secured at: 23 cm Tube secured with: Tape Dental Injury: Teeth and Oropharynx as per pre-operative assessment

## 2012-08-23 NOTE — Op Note (Signed)
[facility] [address] [place], [address]   ERCP PROCEDURE REPORT  PATIENT: [name], [name]  MR# :[id number] BIRTHDATE: 1970-12-23  GENDER: Female ENDOSCOPIST: Dorena Cookey, MD REFERRED BY: PROCEDURE DATE:  08/23/2012 PROCEDURE: ASA CLASS: INDICATIONS:suspected common bile duct stone MEDICATIONS: general anesthesia TOPICAL ANESTHETIC: none  DESCRIPTION OF PROCEDURE:   After the risks benefits and alternatives of the procedure were thoroughly explained, informed consent was obtained.  The Pentax ERCP [id number]  endoscope was introduced through the mouth  and advanced to the papilla of Vater which had a normal appearance. It was cannulated with the Wilson-Cook sphincterotome and selective common bile duct opacification was achieved. There is a single mid common duct filling defect with a ductal dilatation at proximally 8-9 mm. After the guidewire was anchored a large sphincterotomy was performed. Subsequent to that a 12-15 mm adjustable balloon was used to pull the stone into the duodenum, estimated to be about 5-6 mm in diameter. Repeat balloon sweeps revealed no further stones and a cholangiogram at the end of the procedure showed no further filling defects      .  The scope was then completely withdrawn from the patient and the procedure terminated.     COMPLICATIONS: none immediate  ENDOSCOPIC IMPRESSION: common bile duct stone, removed after sphincterotomy RECOMMENDATIONS: observe overnight for improvement in pain and liver function tests and hopefully discharge in the morning if no complications.    _______________________________ Rosalie DoctorDorena Cookey, MD 08/23/2012 4:03 PM   CC:

## 2012-08-24 ENCOUNTER — Encounter (HOSPITAL_COMMUNITY): Payer: Self-pay | Admitting: Gastroenterology

## 2012-08-24 LAB — COMPREHENSIVE METABOLIC PANEL
ALT: 391 U/L — ABNORMAL HIGH (ref 0–35)
Albumin: 3.1 g/dL — ABNORMAL LOW (ref 3.5–5.2)
Alkaline Phosphatase: 108 U/L (ref 39–117)
Potassium: 4.6 mEq/L (ref 3.5–5.1)
Sodium: 139 mEq/L (ref 135–145)
Total Protein: 6.3 g/dL (ref 6.0–8.3)

## 2012-08-24 NOTE — Discharge Summary (Signed)
  [facility] Discharge Summary  [name] MRN: [id number]  Admit date: 08/22/2012 Discharge date: 08/24/2012  Admission Diagnoses: Common bile duct stone  Discharge Diagnoses: Same Active Problems:   Obstructive jaundice likely due to choledocholithiasis   Consults: None  History of Present Illness: The patient is a 42 year old white female status post cholecystectomy and ERCP with common bile duct stone removal one year ago who presented with typical biliary colic recurrent pain and elevated liver function test with a dilated common bile duct seen on ultrasound. For details please see admission history and physical.  Hospital Course: The patient was treated with morphine for her abdominal pain. On the second hospital day her liver function test rose significantly. ERCP was carried out which revealed a single distal common bile duct stone which was removed after sphincterotomy enlargement. Her postoperative course was unremarkable and she was tolerating a regular diet the next morning without any abdominal pain and her liver function tests had improved  Discharged Condition: Improved Disposition: 01-Home or Self Care  Discharge Orders   Future Orders Complete By Expires     Call MD for:  persistant nausea and vomiting  As directed     Call MD for:  severe uncontrolled pain  As directed     Call MD for:  temperature >100.4  As directed     Diet - low sodium heart healthy  As directed     Discharge instructions  As directed     Comments:      Call Dr. Madilyn Fireman for any recurrent abdominal pain or other questions [phone]    Increase activity slowly  As directed         Medication List    TAKE these medications       escitalopram 10 MG tablet  Commonly known as:  LEXAPRO  Take 15 mg by mouth daily.     nitrofurantoin (macrocrystal-monohydrate) 100 MG capsule  Commonly known as:  MACROBID  Take 100 mg by mouth daily.         Signed: Deldrick Linch  C 08/24/2012, 8:33 AM

## 2012-08-24 NOTE — Progress Notes (Signed)
DC HOME WITH HUSBAND, VERBALLY UNDERSTOOD DC INSTRUCTIONS, NO QUESTIONS ASKED

## 2012-08-28 ENCOUNTER — Other Ambulatory Visit: Payer: Self-pay

## 2013-01-06 ENCOUNTER — Other Ambulatory Visit: Payer: Self-pay | Admitting: Family Medicine

## 2013-01-06 DIAGNOSIS — M79605 Pain in left leg: Secondary | ICD-10-CM

## 2013-01-10 ENCOUNTER — Ambulatory Visit
Admission: RE | Admit: 2013-01-10 | Discharge: 2013-01-10 | Disposition: A | Discharge status: Home / Self Care | Payer: Managed Care, Other (non HMO) | Source: Ambulatory Visit | Attending: Family Medicine | Admitting: Family Medicine

## 2013-01-10 DIAGNOSIS — M79605 Pain in left leg: Secondary | ICD-10-CM

## 2013-05-01 IMAGING — US US ABDOMEN COMPLETE
1 series · 13 of 25 positions shown · non-contrast
Comparison: CT of the abdomen and pelvis performed 02/26/2011

CLINICAL DATA: Abdominal pain.

ABDOMINAL ULTRASOUND COMPLETE

[Series 1: us abdomen complete · 0.34mm/px · 13 of 42 slices shown]
[im 1/42]
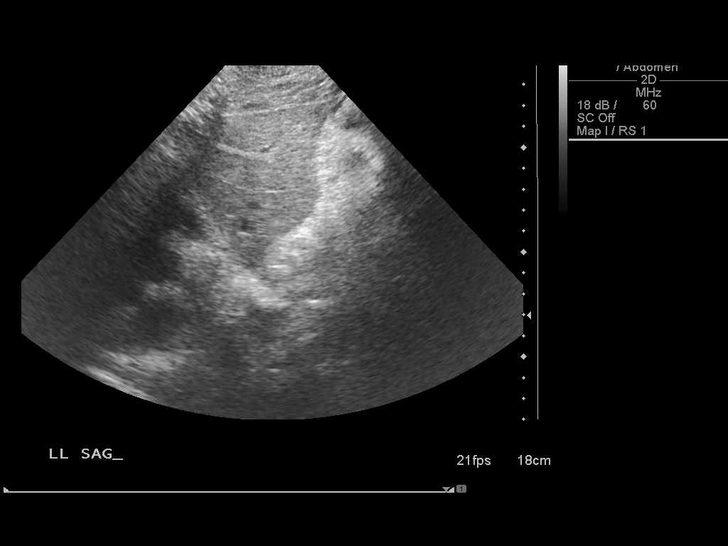
[im 4/42]
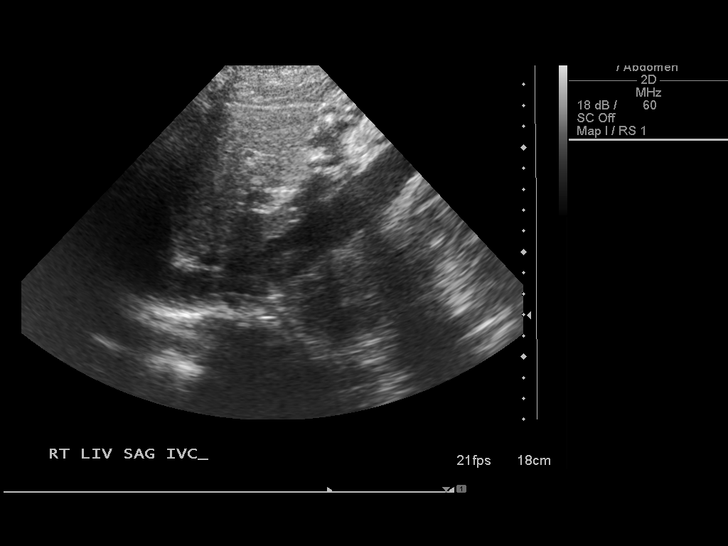
[im 7/42]
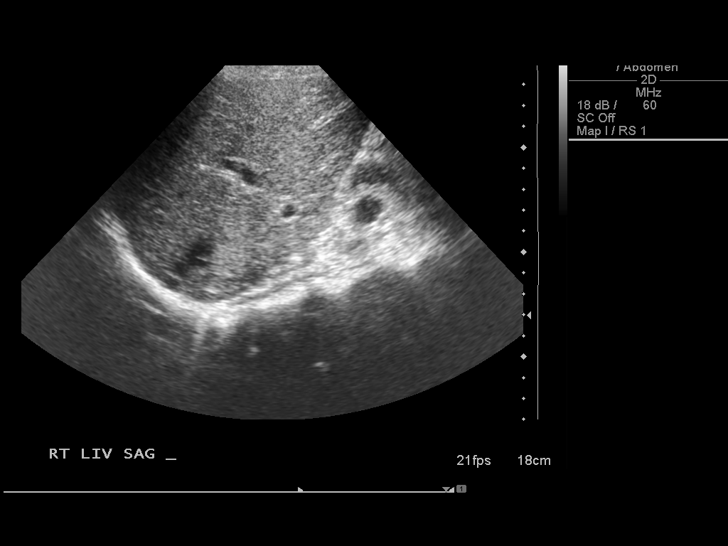
[im 11/42]
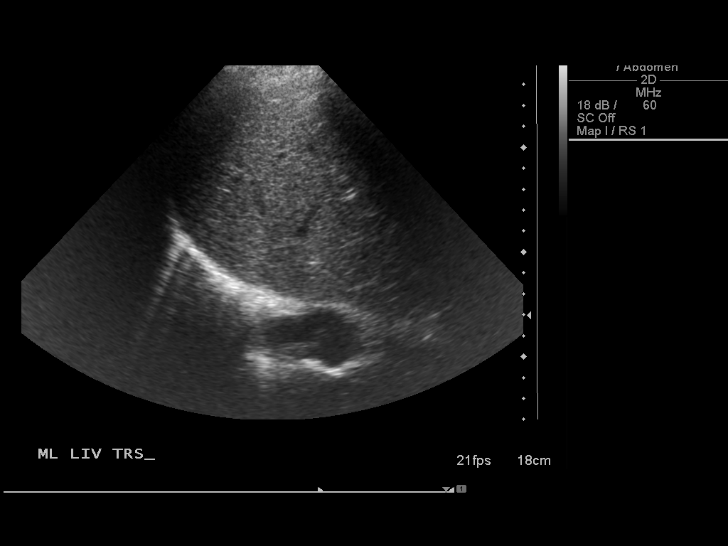
[im 14/42]
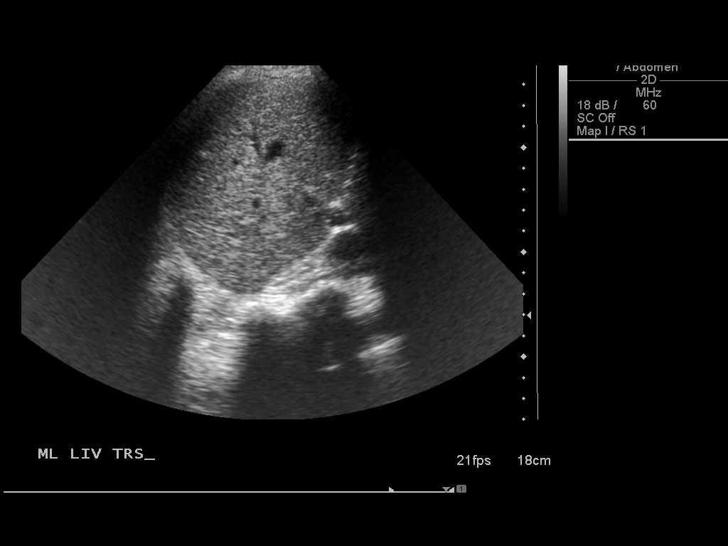
[im 18/42]
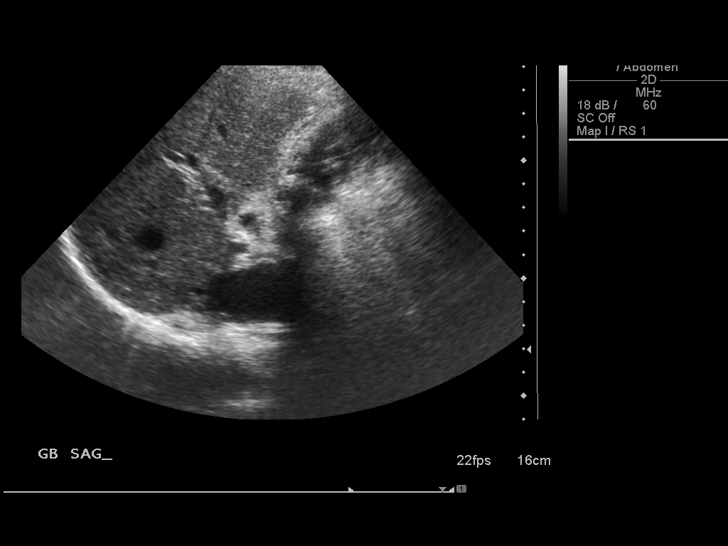
[im 21/42]
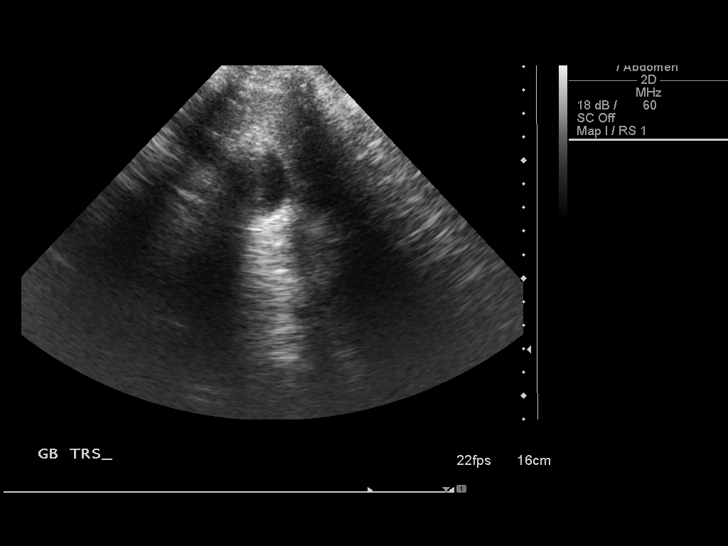
[im 24/42]
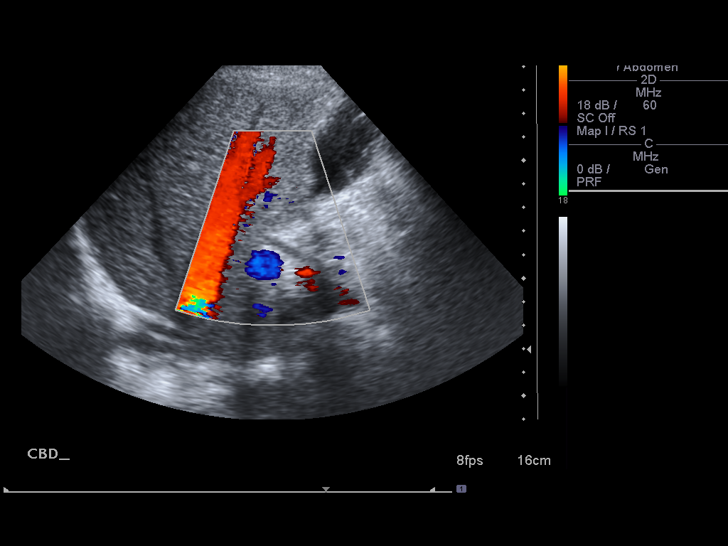
[im 28/42]
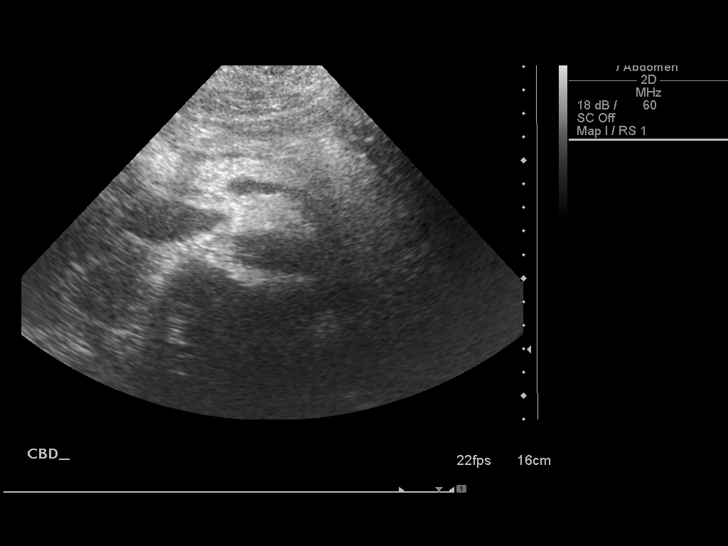
[im 31/42]
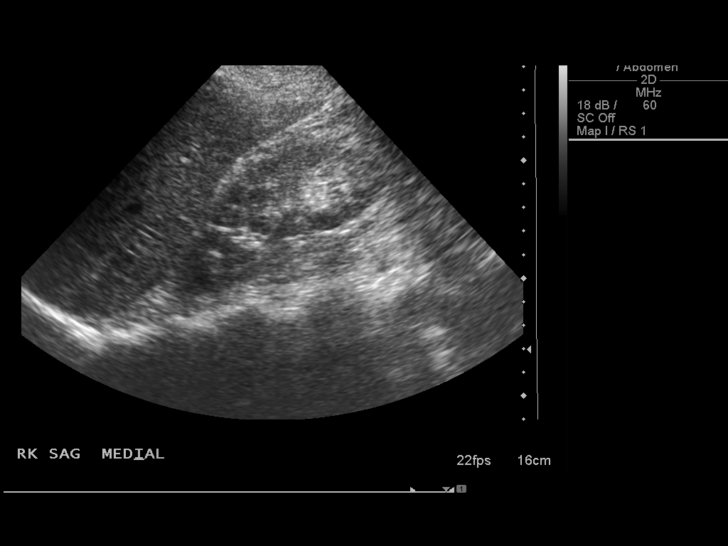
[im 35/42]
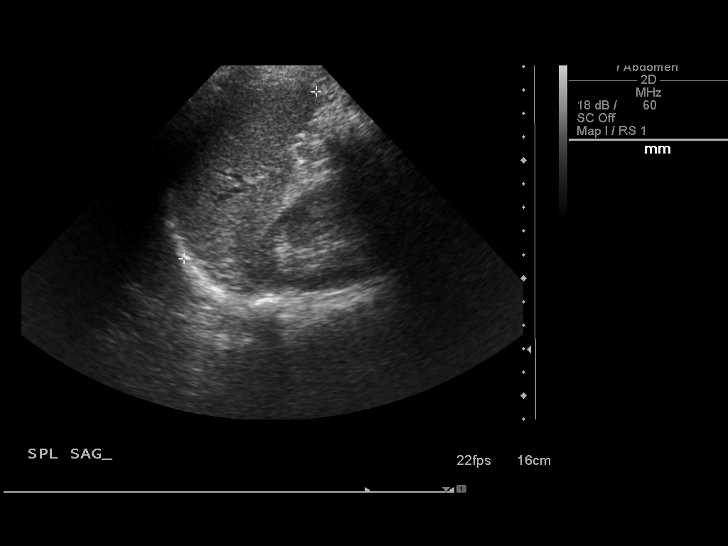
[im 38/42]
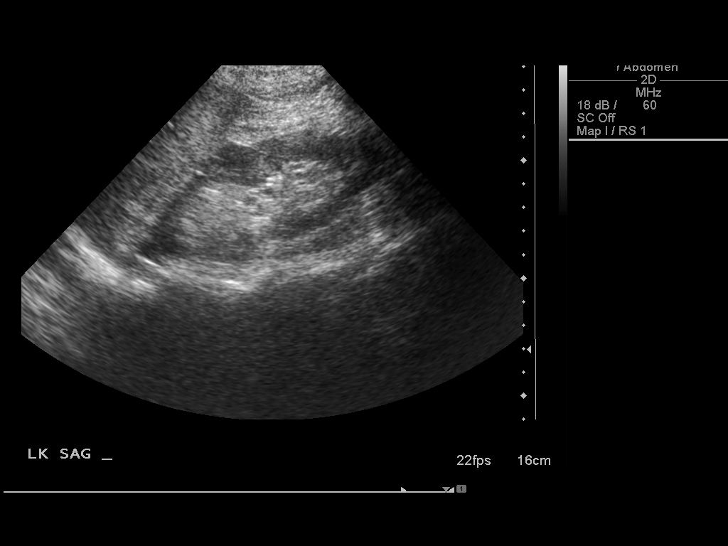
[im 42/42]
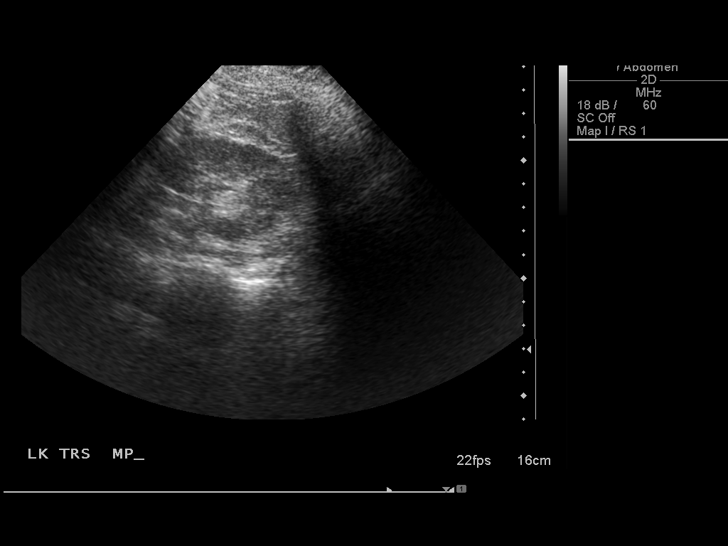

[13 of 25 positions shown; findings below may reference images not displayed]

FINDINGS: Gallbladder:  A wall echo shadow sign is noted, with numerous
stones filling the gallbladder.  The gallbladder is not well
assessed as a result.  No ultrasonographic Murphy's sign is
elicited.  No pericholecystic fluid is seen.

Common Bile Duct:  0.5 cm in diameter; within normal limits in
caliber.

Liver:  Grossly normal parenchymal echogenicity and echotexture; no
focal lesions identified.  Limited Doppler evaluation demonstrates
normal blood flow within the liver.

IVC:  Unremarkable in appearance.

Pancreas:  Although the pancreas is difficult to visualize in its
entirety due to overlying bowel gas, no focal pancreatic
abnormality is identified.

Spleen:  9.1 cm in length; within normal limits in size and
echotexture.

Right kidney:  11.5 cm in length; normal in size, configuration and
parenchymal echogenicity.  No evidence of mass or hydronephrosis.

Left kidney:  11.3 cm in length; normal in size, configuration and
parenchymal echogenicity.  No evidence of mass or hydronephrosis.

Abdominal Aorta:  Normal in caliber; no aneurysm identified.
IMPRESSION: Wall echo shadow sign noted at the gallbladder, reflecting numerous
stones filling the gallbladder.  No evidence for obstruction or
cholecystitis.  No ultrasonographic Murphy's sign elicited.

## 2013-05-01 IMAGING — CR DG ABDOMEN ACUTE W/ 1V CHEST
3 series · 3 of 3 positions shown · non-contrast
Comparison: 02/26/2011

CLINICAL DATA: Pain.

ACUTE ABDOMEN SERIES (ABDOMEN 2 VIEW & CHEST 1 VIEW)

[w chest pa]
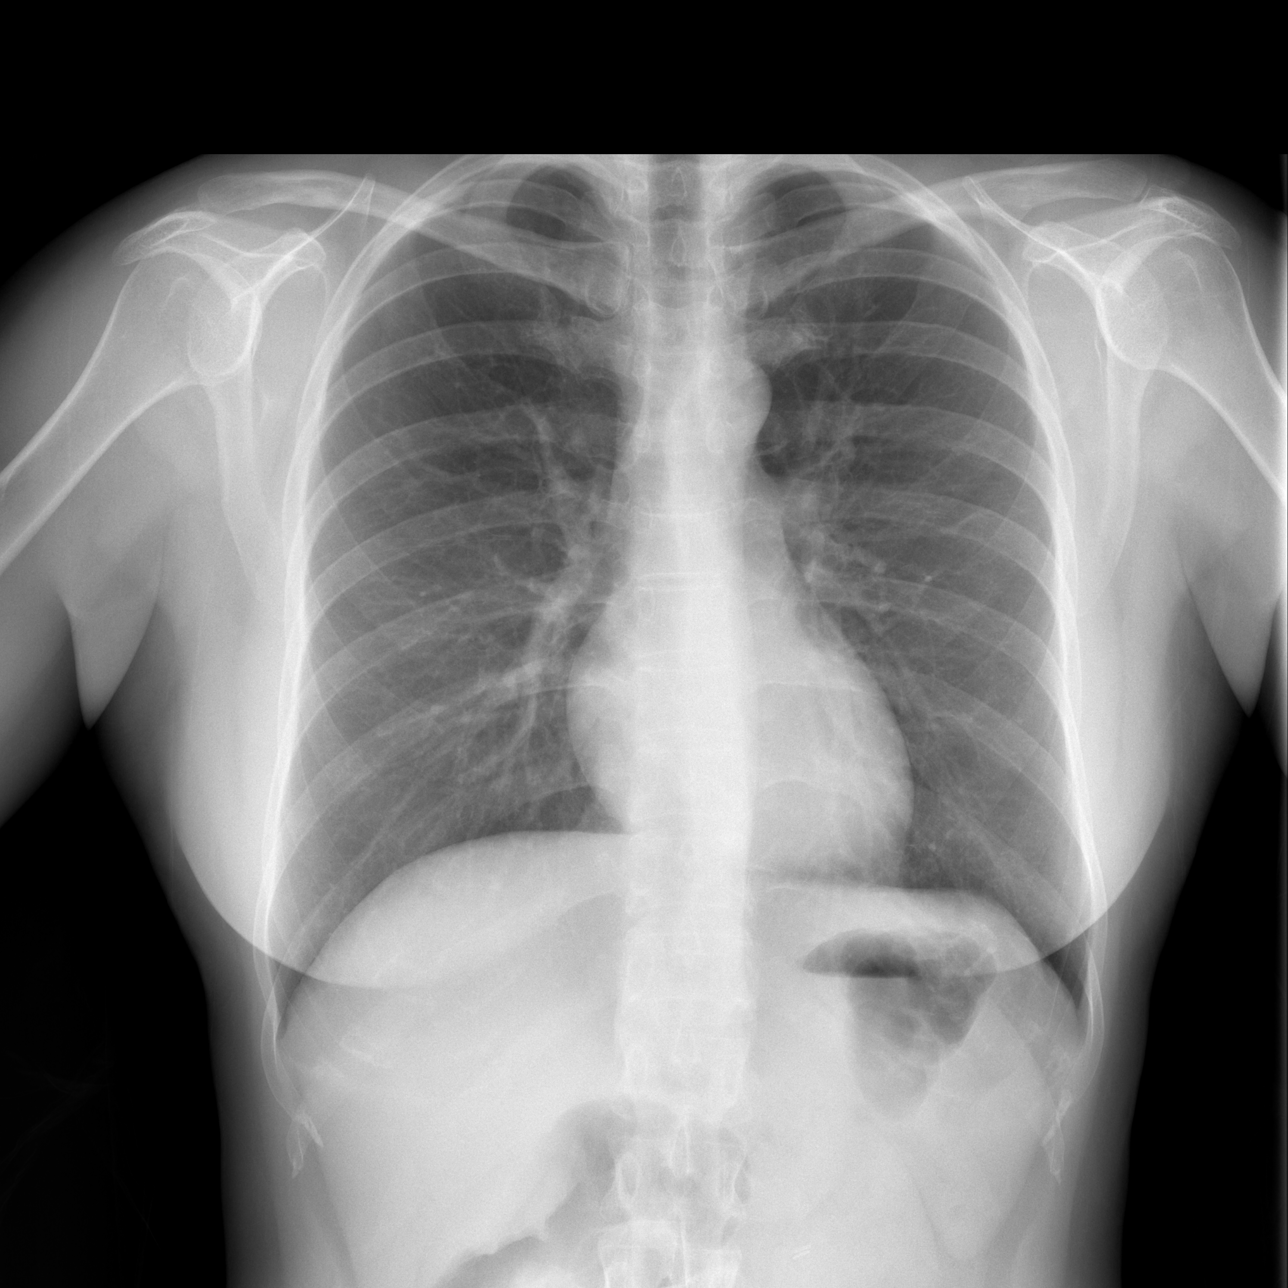

[w abdomen upright]
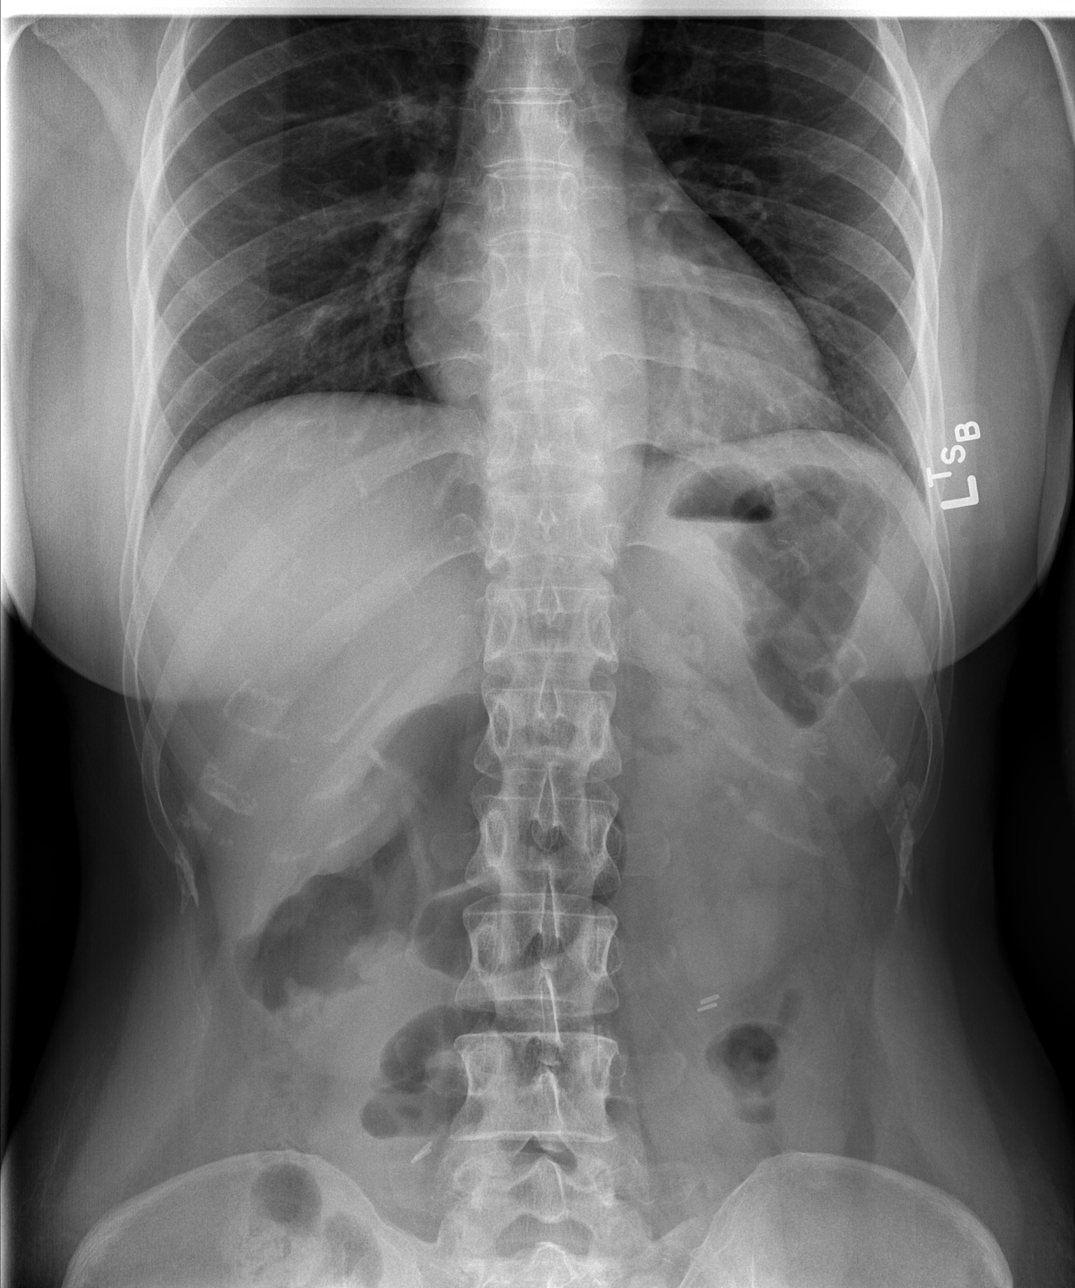

[t abdomen supine]
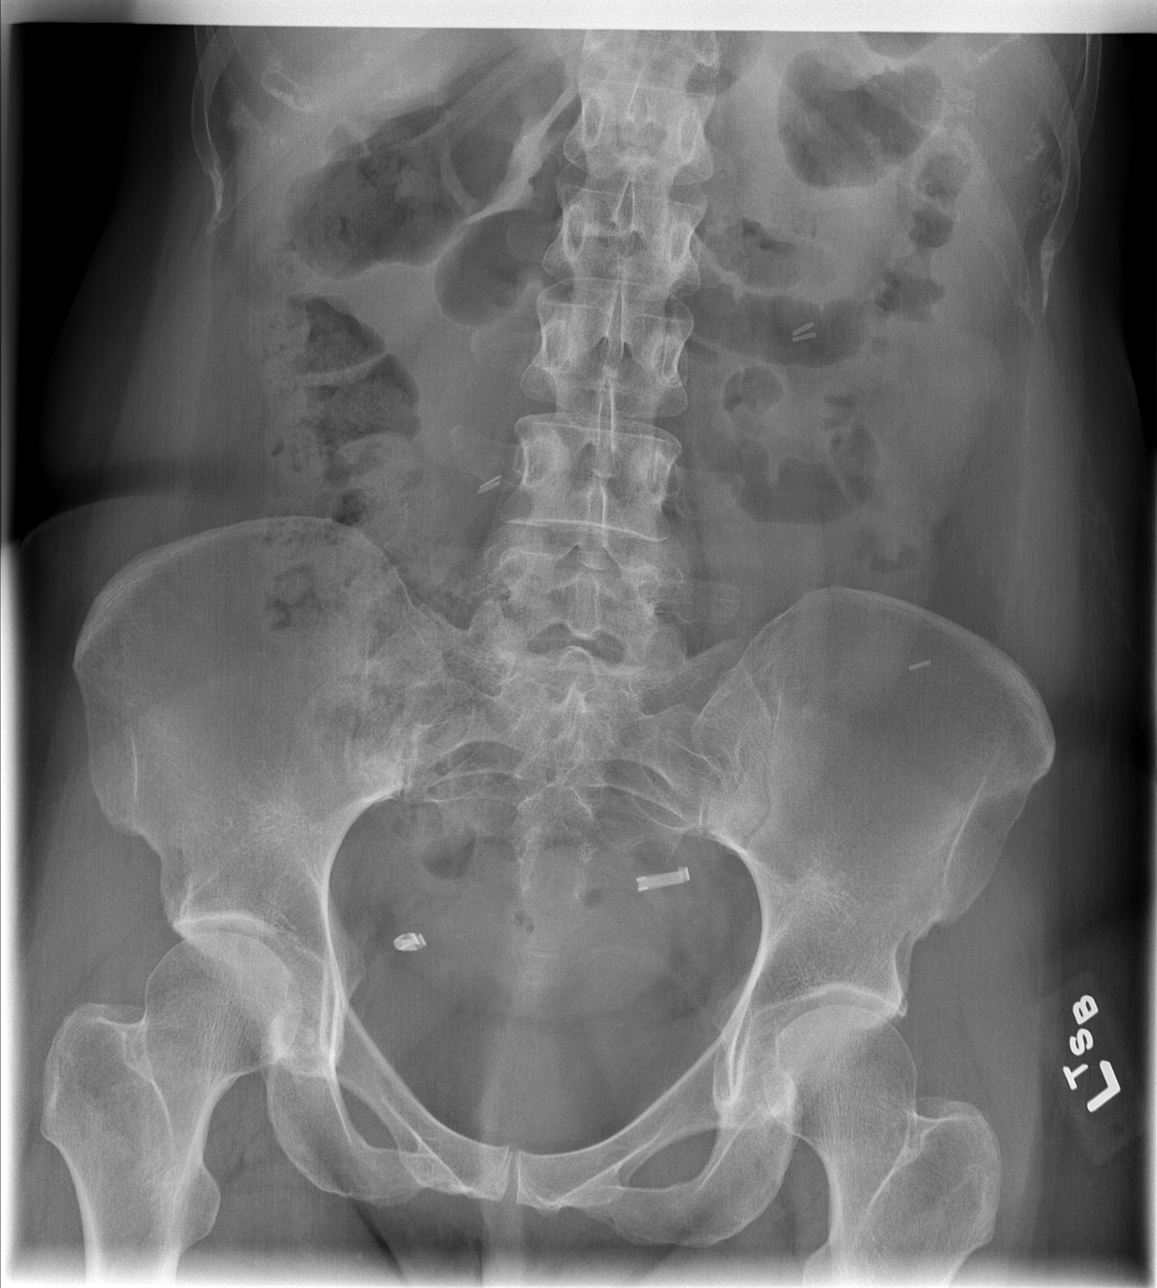

[3 of 3 positions shown; findings below may reference images not displayed]

FINDINGS: The heart size and mediastinal contours are within normal
limits.  Both lungs are clear.  The visualized skeletal structures
are unremarkable.

The bowel gas pattern appears nonobstructed.

No dilated loops of small bowel or fluid levels identified.

Gas and stool identified within the colon.
IMPRESSION: 1.  Nonobstructive bowel gas pattern.
2.  No active cardiopulmonary abnormalities.

## 2013-05-19 ENCOUNTER — Other Ambulatory Visit: Payer: Self-pay

## 2013-06-01 ENCOUNTER — Ambulatory Visit (INDEPENDENT_AMBULATORY_CARE_PROVIDER_SITE_OTHER): Payer: 59 | Admitting: Psychology

## 2013-06-01 DIAGNOSIS — F4320 Adjustment disorder, unspecified: Secondary | ICD-10-CM

## 2013-08-10 ENCOUNTER — Ambulatory Visit: Payer: Managed Care, Other (non HMO) | Admitting: Sports Medicine

## 2013-08-24 ENCOUNTER — Ambulatory Visit: Payer: 59 | Admitting: Psychology

## 2013-10-04 ENCOUNTER — Ambulatory Visit: Payer: Self-pay | Admitting: Sports Medicine

## 2014-01-17 LAB — CBC AND DIFFERENTIAL: Hemoglobin: 13 (ref 12.0–16.0)

## 2014-03-21 IMAGING — US US ABDOMEN COMPLETE
1 series · 14 of 25 positions shown · non-contrast
Comparison: CT abdomen 10/24/2011

CLINICAL DATA: History of gallstones.  Status post
cholecystectomy.

COMPLETE ABDOMINAL ULTRASOUND

[Series 1: us abdomen complete · 0.25mm/px · 14 of 63 slices shown]
[im 1/63]
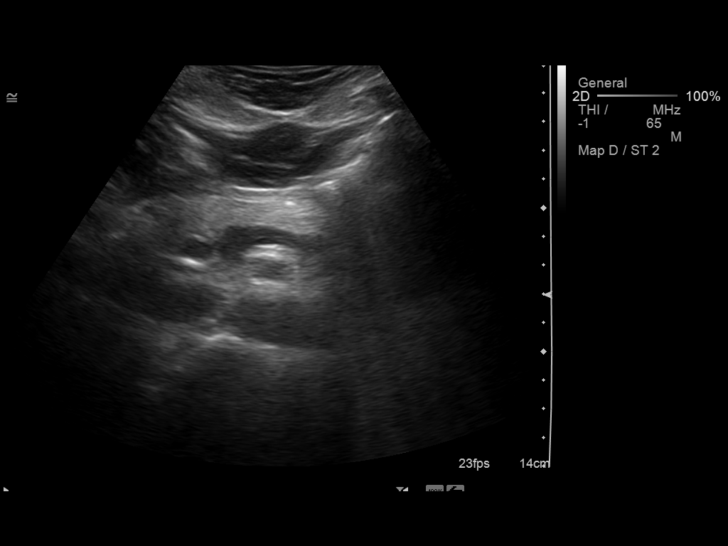
[im 6/63]
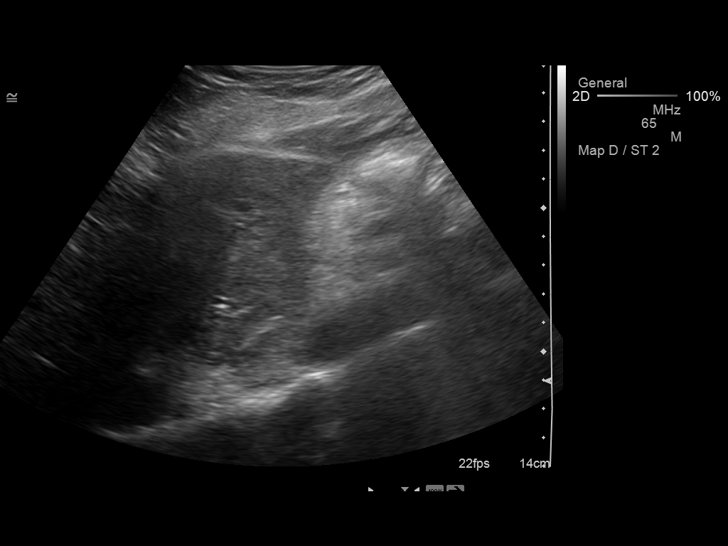
[im 11/63]
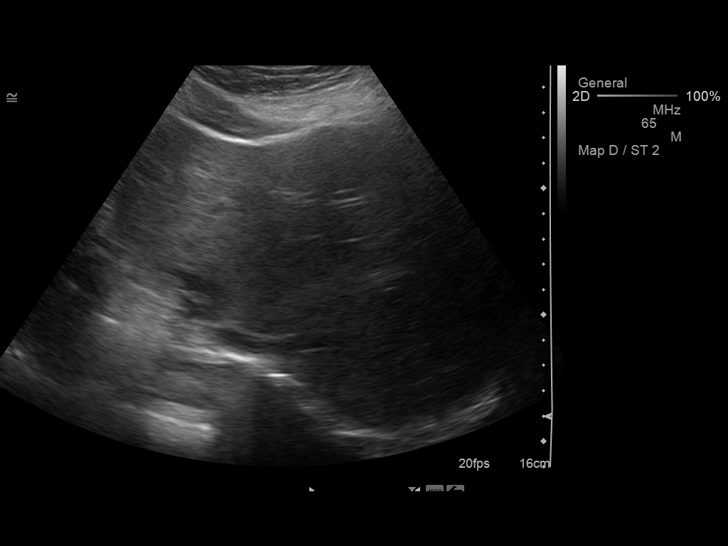
[im 16/63]
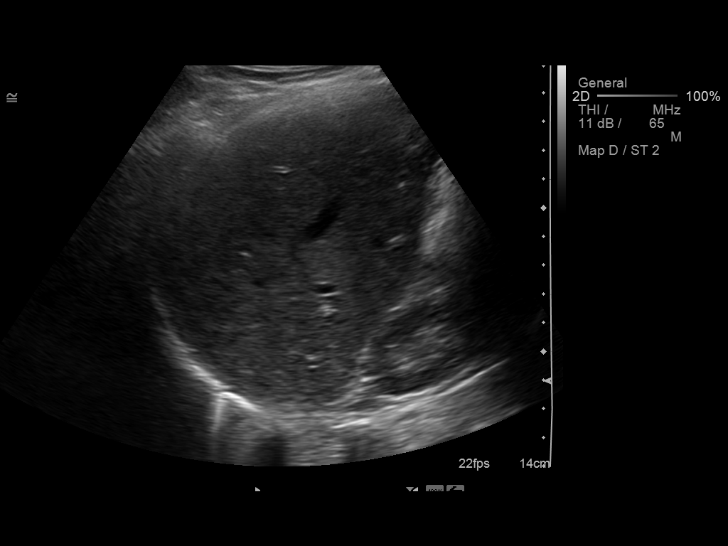
[im 21/63]
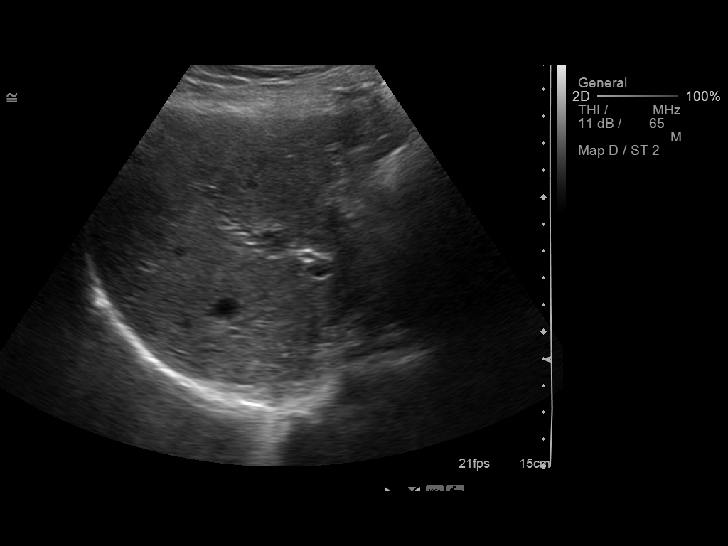
[im 24/63]
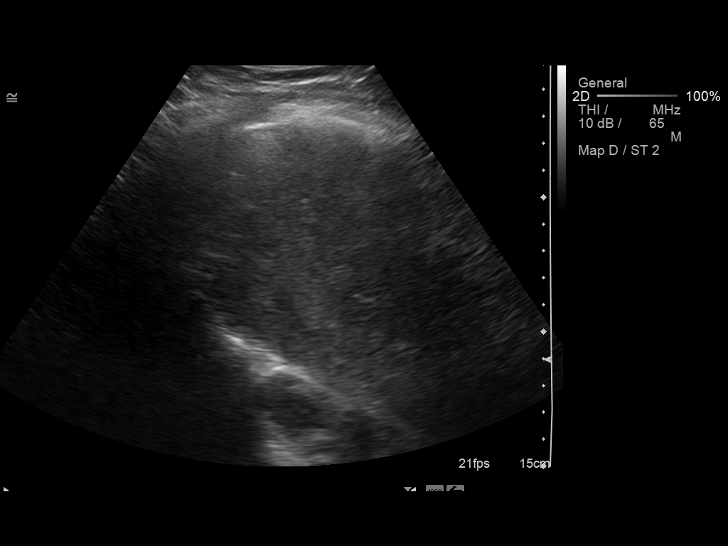
[im 29/63]
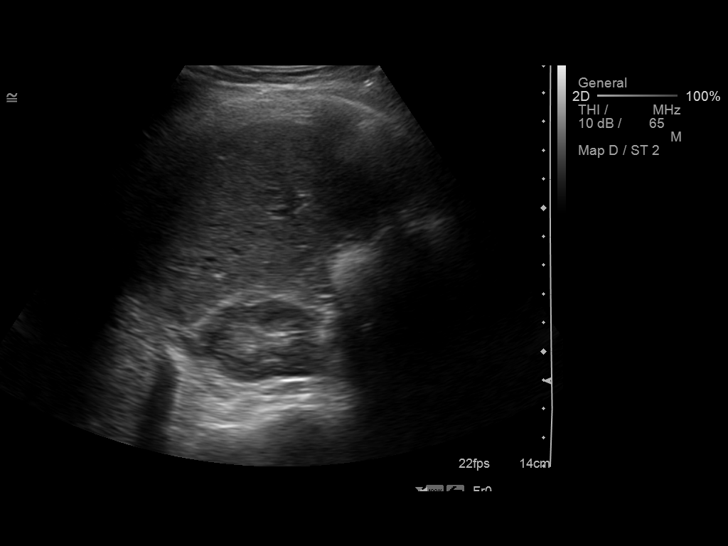
[im 34/63]
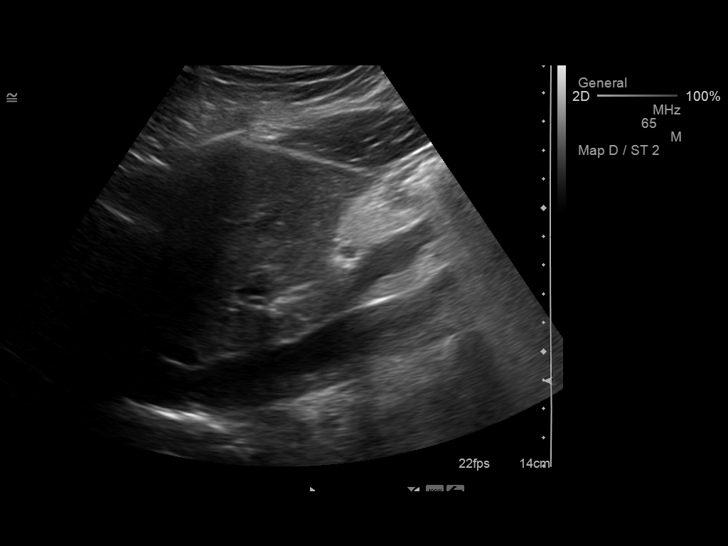
[im 39/63]
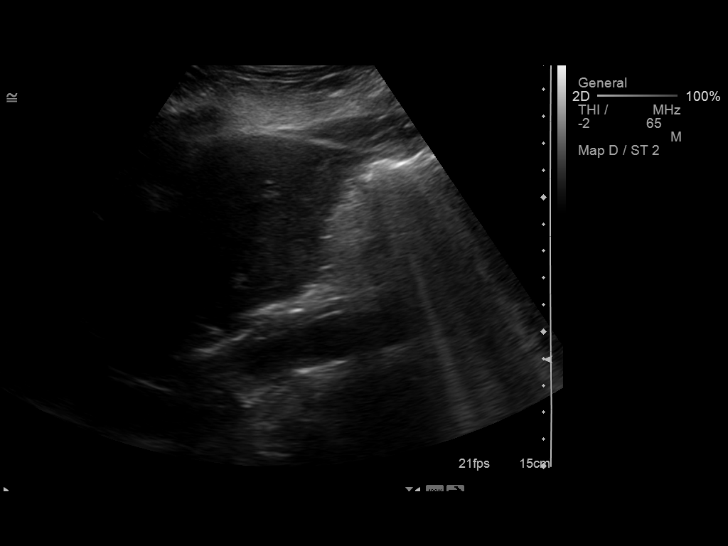
[im 42/63]
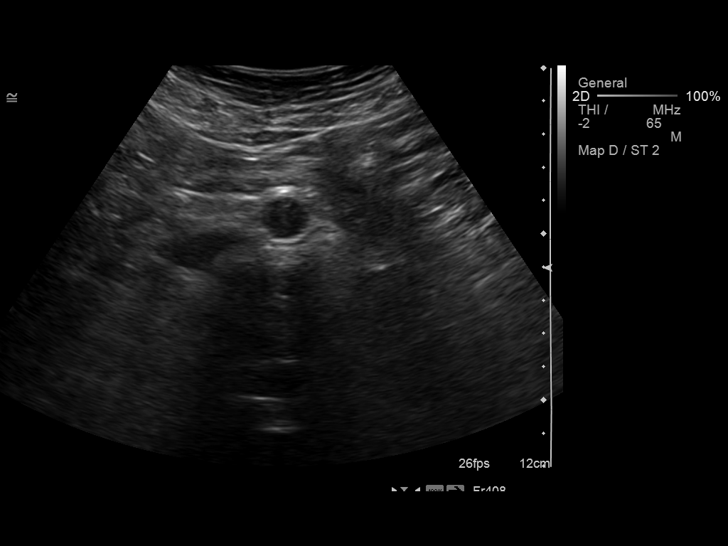
[im 47/63]
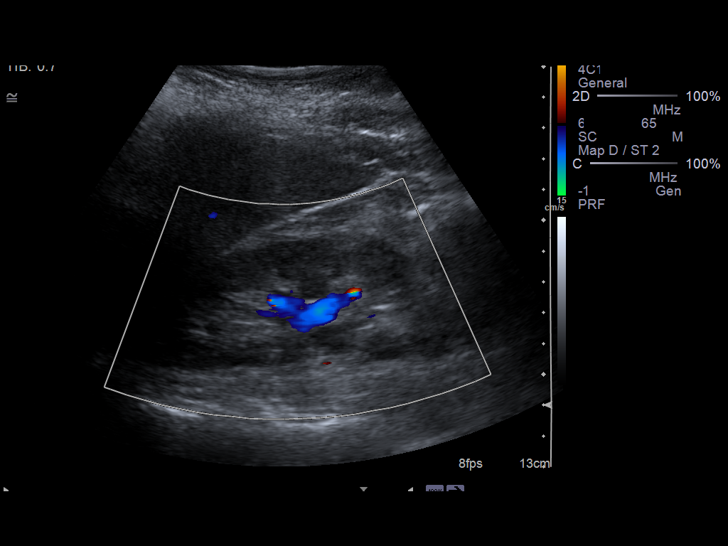
[im 52/63]
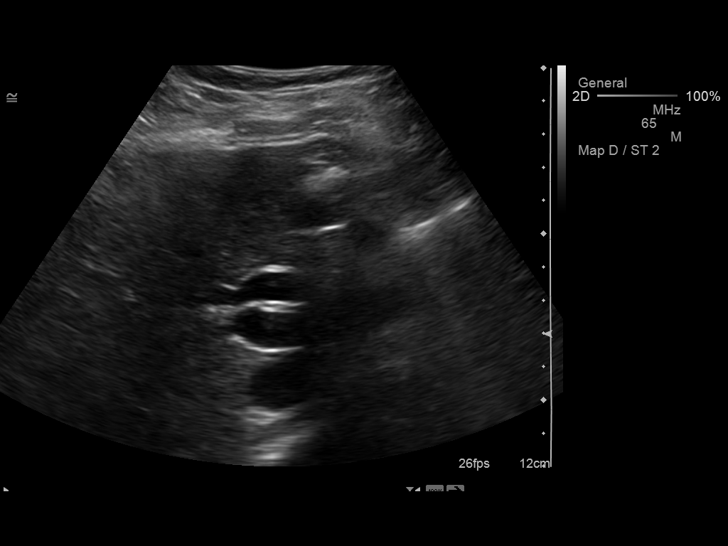
[im 57/63]
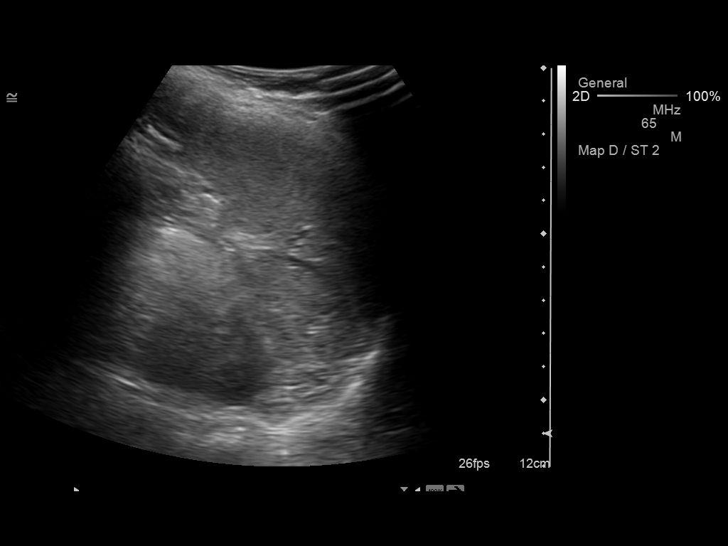
[im 63/63]
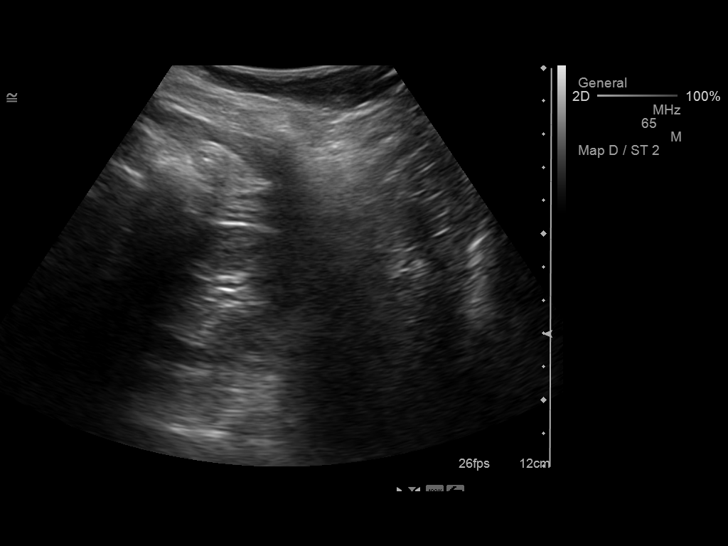

[14 of 25 positions shown; findings below may reference images not displayed]

FINDINGS: Gallbladder:  Surgically absent

Common bile duct:  Dilated  to 9.7 mm.

Liver:  No intrahepatic biliary ductal dilatation is identified.
Normal echogenicity.

IVC:  Appears normal.

Pancreas:  No focal abnormality seen.

Spleen:  Normal in size and echogenicity.

Right Kidney:  11.1cm in length.  No evidence of hydronephrosis or
stones.

Left Kidney:  11.3cm in length.  No evidence of hydronephrosis or
stones.

Abdominal aorta:  No aneurysm identified.
IMPRESSION: 1..  Dilatation the common bile duct without intrahepatic biliary
duct dilatation.  Multiple stones on comparison ERCP from
10/28/2011.  Cannot exclude retained stone.  Recommend correlation
with LFTs.

2.  Cholecystectomy

## 2014-07-27 ENCOUNTER — Encounter (HOSPITAL_COMMUNITY): Payer: Self-pay | Admitting: Gastroenterology

## 2014-08-21 ENCOUNTER — Other Ambulatory Visit: Payer: Self-pay | Admitting: Obstetrics and Gynecology

## 2014-08-31 ENCOUNTER — Other Ambulatory Visit: Payer: Self-pay | Admitting: Obstetrics and Gynecology

## 2015-03-30 ENCOUNTER — Other Ambulatory Visit: Payer: Self-pay | Admitting: Obstetrics and Gynecology

## 2015-04-02 LAB — CYTOLOGY - PAP

## 2016-04-30 ENCOUNTER — Ambulatory Visit (INDEPENDENT_AMBULATORY_CARE_PROVIDER_SITE_OTHER): Payer: 59 | Admitting: Licensed Clinical Social Worker

## 2016-04-30 DIAGNOSIS — F4322 Adjustment disorder with anxiety: Secondary | ICD-10-CM | POA: Diagnosis not present

## 2016-06-02 ENCOUNTER — Ambulatory Visit (INDEPENDENT_AMBULATORY_CARE_PROVIDER_SITE_OTHER): Payer: 59 | Admitting: Licensed Clinical Social Worker

## 2016-06-02 DIAGNOSIS — F3341 Major depressive disorder, recurrent, in partial remission: Secondary | ICD-10-CM

## 2016-07-21 ENCOUNTER — Ambulatory Visit: Payer: 59 | Admitting: Licensed Clinical Social Worker

## 2017-04-07 LAB — HM DEXA SCAN

## 2017-04-08 LAB — HM PAP SMEAR

## 2017-11-03 LAB — BASIC METABOLIC PANEL
BUN: 15 (ref 4–21)
Creatinine: 1 (ref <=1.1)
Glucose: 95
Potassium: 4.9 (ref 3.4–5.3)
Sodium: 142 (ref 137–147)

## 2017-11-03 LAB — HEPATIC FUNCTION PANEL
ALT: 17 (ref 7–35)
AST: 21 (ref 13–35)

## 2017-11-03 LAB — HEMOGLOBIN A1C: Hemoglobin A1C: 5.5

## 2017-11-03 LAB — VITAMIN D 25 HYDROXY (VIT D DEFICIENCY, FRACTURES): Vit D, 25-Hydroxy: 68

## 2017-11-03 LAB — TSH: TSH: 1.17 (ref <=5.90)

## 2018-05-13 LAB — HM MAMMOGRAPHY

## 2018-05-13 LAB — CBC AND DIFFERENTIAL: Hemoglobin: 12.9 (ref 12.0–16.0)

## 2018-05-17 ENCOUNTER — Other Ambulatory Visit: Payer: Self-pay | Admitting: Obstetrics and Gynecology

## 2018-05-17 DIAGNOSIS — R928 Other abnormal and inconclusive findings on diagnostic imaging of breast: Secondary | ICD-10-CM

## 2018-05-20 ENCOUNTER — Ambulatory Visit
Admission: RE | Admit: 2018-05-20 | Discharge: 2018-05-20 | Disposition: A | Discharge status: Home / Self Care | Payer: PRIVATE HEALTH INSURANCE | Source: Ambulatory Visit | Attending: Obstetrics and Gynecology | Admitting: Obstetrics and Gynecology

## 2018-05-20 ENCOUNTER — Other Ambulatory Visit: Payer: Self-pay | Admitting: Obstetrics and Gynecology

## 2018-05-20 DIAGNOSIS — N63 Unspecified lump in unspecified breast: Secondary | ICD-10-CM

## 2018-05-20 DIAGNOSIS — N6011 Diffuse cystic mastopathy of right breast: Secondary | ICD-10-CM | POA: Diagnosis not present

## 2018-05-20 DIAGNOSIS — R928 Other abnormal and inconclusive findings on diagnostic imaging of breast: Secondary | ICD-10-CM

## 2018-05-20 DIAGNOSIS — R922 Inconclusive mammogram: Secondary | ICD-10-CM | POA: Diagnosis not present

## 2018-07-14 HISTORY — PX: REFRACTIVE SURGERY: SHX103

## 2018-08-05 DIAGNOSIS — H00015 Hordeolum externum left lower eyelid: Secondary | ICD-10-CM | POA: Diagnosis not present

## 2018-08-22 DIAGNOSIS — H00011 Hordeolum externum right upper eyelid: Secondary | ICD-10-CM | POA: Diagnosis not present

## 2018-08-22 DIAGNOSIS — H01003 Unspecified blepharitis right eye, unspecified eyelid: Secondary | ICD-10-CM | POA: Diagnosis not present

## 2018-08-22 DIAGNOSIS — H05011 Cellulitis of right orbit: Secondary | ICD-10-CM | POA: Diagnosis not present

## 2018-11-12 LAB — HM MAMMOGRAPHY

## 2018-11-22 ENCOUNTER — Ambulatory Visit
Admission: RE | Admit: 2018-11-22 | Discharge: 2018-11-22 | Disposition: A | Discharge status: Home / Self Care | Payer: BLUE CROSS/BLUE SHIELD | Source: Ambulatory Visit | Attending: Obstetrics and Gynecology | Admitting: Obstetrics and Gynecology

## 2018-11-22 ENCOUNTER — Other Ambulatory Visit: Payer: Self-pay

## 2018-11-22 DIAGNOSIS — N63 Unspecified lump in unspecified breast: Secondary | ICD-10-CM

## 2018-11-22 DIAGNOSIS — N6310 Unspecified lump in the right breast, unspecified quadrant: Secondary | ICD-10-CM | POA: Diagnosis not present

## 2019-01-20 DIAGNOSIS — L719 Rosacea, unspecified: Secondary | ICD-10-CM | POA: Diagnosis not present

## 2019-01-20 DIAGNOSIS — L814 Other melanin hyperpigmentation: Secondary | ICD-10-CM | POA: Diagnosis not present

## 2019-01-20 DIAGNOSIS — D225 Melanocytic nevi of trunk: Secondary | ICD-10-CM | POA: Diagnosis not present

## 2019-01-20 DIAGNOSIS — L821 Other seborrheic keratosis: Secondary | ICD-10-CM | POA: Diagnosis not present

## 2019-03-09 DIAGNOSIS — N952 Postmenopausal atrophic vaginitis: Secondary | ICD-10-CM | POA: Diagnosis not present

## 2019-04-19 ENCOUNTER — Encounter: Payer: Self-pay | Admitting: Family Medicine

## 2019-04-26 ENCOUNTER — Encounter: Payer: Self-pay | Admitting: Family Medicine

## 2019-04-26 ENCOUNTER — Other Ambulatory Visit: Payer: Self-pay

## 2019-04-26 ENCOUNTER — Telehealth: Payer: Self-pay

## 2019-04-26 ENCOUNTER — Ambulatory Visit (INDEPENDENT_AMBULATORY_CARE_PROVIDER_SITE_OTHER): Payer: BC Managed Care – PPO | Admitting: Family Medicine

## 2019-04-26 VITALS — BP 118/88 | HR 69 | Temp 98.0°F | Resp 17 | Ht 62.75 in | Wt 154.0 lb

## 2019-04-26 DIAGNOSIS — E663 Overweight: Secondary | ICD-10-CM | POA: Diagnosis not present

## 2019-04-26 DIAGNOSIS — Z7689 Persons encountering health services in other specified circumstances: Secondary | ICD-10-CM | POA: Diagnosis not present

## 2019-04-26 DIAGNOSIS — Z131 Encounter for screening for diabetes mellitus: Secondary | ICD-10-CM

## 2019-04-26 DIAGNOSIS — Z130 Encounter for screening for diseases of the blood and blood-forming organs and certain disorders involving the immune mechanism: Secondary | ICD-10-CM | POA: Diagnosis not present

## 2019-04-26 DIAGNOSIS — M8580 Other specified disorders of bone density and structure, unspecified site: Secondary | ICD-10-CM | POA: Insufficient documentation

## 2019-04-26 DIAGNOSIS — E559 Vitamin D deficiency, unspecified: Secondary | ICD-10-CM

## 2019-04-26 DIAGNOSIS — M810 Age-related osteoporosis without current pathological fracture: Secondary | ICD-10-CM | POA: Insufficient documentation

## 2019-04-26 DIAGNOSIS — Z23 Encounter for immunization: Secondary | ICD-10-CM

## 2019-04-26 DIAGNOSIS — Z800 Family history of malignant neoplasm of digestive organs: Secondary | ICD-10-CM | POA: Diagnosis not present

## 2019-04-26 DIAGNOSIS — Z0000 Encounter for general adult medical examination without abnormal findings: Secondary | ICD-10-CM | POA: Diagnosis not present

## 2019-04-26 LAB — CBC WITH DIFFERENTIAL/PLATELET
Basophils Absolute: 0 10*3/uL (ref 0.0–0.1)
Basophils Relative: 0.6 % (ref 0.0–3.0)
Eosinophils Absolute: 0 10*3/uL (ref 0.0–0.7)
Eosinophils Relative: 0.9 % (ref 0.0–5.0)
HCT: 40.8 % (ref 36.0–46.0)
Hemoglobin: 13.7 g/dL (ref 12.0–15.0)
Lymphocytes Relative: 28.6 % (ref 12.0–46.0)
Lymphs Abs: 1.5 10*3/uL (ref 0.7–4.0)
MCHC: 33.5 g/dL (ref 30.0–36.0)
MCV: 100.6 fl — ABNORMAL HIGH (ref 78.0–100.0)
Monocytes Absolute: 0.3 10*3/uL (ref 0.1–1.0)
Monocytes Relative: 6.1 % (ref 3.0–12.0)
Neutro Abs: 3.3 10*3/uL (ref 1.4–7.7)
Neutrophils Relative %: 63.8 % (ref 43.0–77.0)
Platelets: 235 10*3/uL (ref 150.0–400.0)
RBC: 4.06 Mil/uL (ref 3.87–5.11)
RDW: 12.7 % (ref 11.5–15.5)
WBC: 5.2 10*3/uL (ref 4.0–10.5)

## 2019-04-26 LAB — LIPID PANEL
Cholesterol: 223 mg/dL — ABNORMAL HIGH (ref 0–200)
HDL: 51 mg/dL (ref >39.00)
LDL Cholesterol: 141 mg/dL — ABNORMAL HIGH (ref 0–99)
NonHDL: 171.9
Total CHOL/HDL Ratio: 4
Triglycerides: 155 mg/dL — ABNORMAL HIGH (ref 0.0–149.0)
VLDL: 31 mg/dL (ref 0.0–40.0)

## 2019-04-26 LAB — COMPREHENSIVE METABOLIC PANEL
ALT: 13 U/L (ref 0–35)
AST: 18 U/L (ref 0–37)
Albumin: 4.7 g/dL (ref 3.5–5.2)
Alkaline Phosphatase: 56 U/L (ref 39–117)
BUN: 12 mg/dL (ref 6–23)
CO2: 29 mEq/L (ref 19–32)
Calcium: 9.6 mg/dL (ref 8.4–10.5)
Chloride: 101 mEq/L (ref 96–112)
Creatinine, Ser: 0.8 mg/dL (ref 0.40–1.20)
GFR: 76.23 mL/min (ref >60.00)
Glucose, Bld: 84 mg/dL (ref 70–99)
Potassium: 4 mEq/L (ref 3.5–5.1)
Sodium: 139 mEq/L (ref 135–145)
Total Bilirubin: 0.6 mg/dL (ref 0.2–1.2)
Total Protein: 7.4 g/dL (ref 6.0–8.3)

## 2019-04-26 LAB — TSH: TSH: 1.31 u[IU]/mL (ref 0.35–4.50)

## 2019-04-26 LAB — HEMOGLOBIN A1C: Hgb A1c MFr Bld: 5.6 % (ref 4.6–6.5)

## 2019-04-26 LAB — VITAMIN D 25 HYDROXY (VIT D DEFICIENCY, FRACTURES): VITD: >120 ng/mL

## 2019-04-26 NOTE — Telephone Encounter (Signed)
Noted. Will discuss all labs with her once resulted.

## 2019-04-26 NOTE — Telephone Encounter (Signed)
Critical lab from [facility] lab. Vit D is greater than 120.

## 2019-04-26 NOTE — Progress Notes (Signed)
Patient ID: [name], female  DOB: 15-Aug-1970, 48 y.o.   MRN: [id number] Patient Care Team    Relationship Specialty Notifications Start End  Ma Hillock, DO PCP - General Family Medicine  04/19/19   Everlene Farrier, MD Consulting Physician Obstetrics and Gynecology  04/26/19   Teena Irani, MD (Inactive)  Gastroenterology  04/26/19     Chief Complaint  Patient presents with  . Establish Care    Needs CPE. Not fasting. Pt has not had PCP in many years. Dr Marisue Humble was previous and Dr [facility]. 2018 pap smear. Mammogram 11/2017.     Subjective: [name] is a 48 y.o.  female present for new patient establishment/CPE. All past medical history, surgical history, allergies, family history, immunizations, medications and social history were updated in the electronic medical record today. All recent labs, ED visits and hospitalizations within the last year were reviewed.  Health maintenance:  Colonoscopy: Mother Fhx in mother at 4 (lynch syndrome) pt tested negative for lynch. Routine screen at 70  Mammogram: completed:11/2018, by GYN  Cervical cancer screening: last pap: 2018,  completed by: GYN Immunizations: tdap 2005>> updated today, Influenza UTD 2020 (encouraged yearly), zostavax at 49 Infectious disease screening: HIV completed DEXA: last completed 03/2017, osteoporosis.  Assistive device: none Oxygen IOM:BTDH Patient has a Dental home. Hospitalizations/ED visits:reviewed  Depression screen [facility] 2/9 04/26/2019  Decreased Interest 0  Down, Depressed, Hopeless 0  PHQ - 2 Score 0   No flowsheet data found.     No flowsheet data found.  Immunization History  Administered Date(s) Administered  . Influenza Inj Mdck Quad With Preservative 04/06/2019  . Td 04/26/2019    No exam data present  Past Medical History:  Diagnosis Date  . Chicken pox   . Depression   . Gall stones   . Herpes infection   . Obstructive jaundice likely due to  choledocholithiasis 10/26/2011  . Osteoporosis   . PONV (postoperative nausea and vomiting)    No Known Allergies Past Surgical History:  Procedure Laterality Date  . ABDOMINOPLASTY  10-06-11   11-'11Tummy Tuck  . APPENDECTOMY  2004  . BREAST BIOPSY Right   . CESAREAN SECTION  10-06-11   '04/ '05  . CHOLECYSTECTOMY  10/10/2011   Procedure: LAPAROSCOPIC CHOLECYSTECTOMY WITH INTRAOPERATIVE CHOLANGIOGRAM;  Surgeon: Marcello Moores A. Cornett, MD;  Location: [facility];  Service: General;  Laterality: N/A;  Laparoscopic Cholecystectomy and Cholangiogram  . ERCP  10/25/2011   Procedure: ENDOSCOPIC RETROGRADE CHOLANGIOPANCREATOGRAPHY (ERCP);  Surgeon: Wonda Horner, MD;  Location: [facility];  Service: Endoscopy;  Laterality: N/A;  dr Penelope Coop would like to do this case @ 1100 or earlier , but not later.. Call nurse  Pls up date with time  . ERCP  10/28/2011   Procedure: ENDOSCOPIC RETROGRADE CHOLANGIOPANCREATOGRAPHY (ERCP);  Surgeon: Jeryl Columbia, MD;  Location: [facility];  Service: Endoscopy;  Laterality: N/A;  with stone extraction  . ERCP N/A 08/23/2012   Procedure: ENDOSCOPIC RETROGRADE CHOLANGIOPANCREATOGRAPHY (ERCP);  Surgeon: Missy Sabins, MD;  Location: [facility];  Service: Gastroenterology;  Laterality: N/A;  . REFRACTIVE SURGERY  2020  . TUBAL LIGATION  2010  . WISDOM TOOTH EXTRACTION     Family History  Problem Relation Age of Onset  . Colon cancer Mother 70       lynch syndrome.   . Pulmonary fibrosis Mother        idopathic  . Depression Mother   . Lung cancer Maternal  Grandmother   . Lung cancer Maternal Grandfather   . ADD / ADHD Son   . Osteoporosis Paternal Grandmother   . Diabetes Paternal Grandfather   . Heart attack Paternal Grandfather   . Alcohol abuse Maternal Aunt    Social History   Social History Narrative   Marital status/children/pets: Married. 4 children.   Safety:      -smoke alarm in the home:Yes     - wears seatbelt: Yes     - Feels safe in their relationships: Yes     Allergies as of 04/26/2019   No Known Allergies     Medication List       Accurate as of April 26, 2019  6:00 PM. If you have any questions, ask your nurse or doctor.        STOP taking these medications   escitalopram 10 MG tablet Commonly known as: LEXAPRO Stopped by: Howard Pouch, DO   nitrofurantoin (macrocrystal-monohydrate) 100 MG capsule Commonly known as: MACROBID Stopped by: Howard Pouch, DO     TAKE these medications   Divigel 1 MG/GM Gel Generic drug: Estradiol Apply topically daily. as directed   multivitamin with minerals tablet Take by mouth.   progesterone 100 MG capsule Commonly known as: PROMETRIUM Take 100 mg by mouth at bedtime. What changed: Another medication with the same name was removed. Continue taking this medication, and follow the directions you see here. Changed by: Howard Pouch, DO   valACYclovir 500 MG tablet Commonly known as: VALTREX valacyclovir 500 mg tablet  TAKE 1 TABLET BY MOUTH EVERY DAY       All past medical history, surgical history, allergies, family history, immunizations andmedications were updated in the EMR today and reviewed under the history and medication portions of their EMR.    Recent Results (from the past 2160 hour(s))  CBC w/Diff     Status: Abnormal   Collection Time: 04/26/19 10:46 AM  Result Value Ref Range   WBC 5.2 4.0 - 10.5 K/uL   RBC 4.06 3.87 - 5.11 Mil/uL   Hemoglobin 13.7 12.0 - 15.0 g/dL   HCT 40.8 36.0 - 46.0 %   MCV 100.6 (H) 78.0 - 100.0 fl   MCHC 33.5 30.0 - 36.0 g/dL   RDW 12.7 11.5 - 15.5 %   Platelets 235.0 150.0 - 400.0 K/uL   Neutrophils Relative % 63.8 43.0 - 77.0 %   Lymphocytes Relative 28.6 12.0 - 46.0 %   Monocytes Relative 6.1 3.0 - 12.0 %   Eosinophils Relative 0.9 0.0 - 5.0 %   Basophils Relative 0.6 0.0 - 3.0 %   Neutro Abs 3.3 1.4 - 7.7 K/uL   Lymphs Abs 1.5 0.7 - 4.0 K/uL   Monocytes Absolute 0.3 0.1 - 1.0 K/uL   Eosinophils Absolute 0.0 0.0 - 0.7 K/uL    Basophils Absolute 0.0 0.0 - 0.1 K/uL  Comp Met (CMET)     Status: None   Collection Time: 04/26/19 10:46 AM  Result Value Ref Range   Sodium 139 135 - 145 mEq/L   Potassium 4.0 3.5 - 5.1 mEq/L   Chloride 101 96 - 112 mEq/L   CO2 29 19 - 32 mEq/L   Glucose, Bld 84 70 - 99 mg/dL   BUN 12 6 - 23 mg/dL   Creatinine, Ser 0.80 0.40 - 1.20 mg/dL   Total Bilirubin 0.6 0.2 - 1.2 mg/dL   Alkaline Phosphatase 56 39 - 117 U/L   AST 18 0 - 37 U/L  ALT 13 0 - 35 U/L   Total Protein 7.4 6.0 - 8.3 g/dL   Albumin 4.7 3.5 - 5.2 g/dL   Calcium 9.6 8.4 - 10.5 mg/dL   GFR 76.23 >60.00 mL/min  Lipid panel     Status: Abnormal   Collection Time: 04/26/19 10:46 AM  Result Value Ref Range   Cholesterol 223 (H) 0 - 200 mg/dL    Comment: ATP III Classification       Desirable:  < 200 mg/dL               Borderline High:  200 - 239 mg/dL          High:  > = 240 mg/dL   Triglycerides 155.0 (H) 0.0 - 149.0 mg/dL    Comment: Normal:  <150 mg/dLBorderline High:  150 - 199 mg/dL   HDL 51.00 >39.00 mg/dL   VLDL 31.0 0.0 - 40.0 mg/dL   LDL Cholesterol 141 (H) 0 - 99 mg/dL   Total CHOL/HDL Ratio 4     Comment:                Men          Women1/2 Average Risk     3.4          3.3Average Risk          5.0          4.42X Average Risk          9.6          7.13X Average Risk          15.0          11.0                       NonHDL 171.90     Comment: NOTE:  Non-HDL goal should be 30 mg/dL higher than patient's LDL goal (i.e. LDL goal of < 70 mg/dL, would have non-HDL goal of < 100 mg/dL)  Hemoglobin A1c     Status: None   Collection Time: 04/26/19 10:46 AM  Result Value Ref Range   Hgb A1c MFr Bld 5.6 4.6 - 6.5 %    Comment: Glycemic Control Guidelines for People with Diabetes:Non Diabetic:  <6%Goal of Therapy: <7%Additional Action Suggested:  >8%   Vitamin D (25 hydroxy)     Status: None   Collection Time: 04/26/19 10:46 AM  Result Value Ref Range   VITD >120 ng/mL  TSH     Status: None   Collection Time:  04/26/19 10:46 AM  Result Value Ref Range   TSH 1.31 0.35 - 4.50 uIU/mL     ROS: 14 pt review of systems performed and negative (unless mentioned in an HPI)  Objective: BP 118/88 (BP Location: Left Arm, Patient Position: Sitting, Cuff Size: Normal)   Pulse 69   Temp 98 F (36.7 C) (Temporal)   Resp 17   Ht 5' 2.75" (1.594 m)   Wt 154 lb (69.9 kg)   SpO2 100%   BMI 27.50 kg/m  Gen: Afebrile. No acute distress. Nontoxic in appearance, well-developed, well-nourished,  Pleasant, female.  HENT: AT. [place]. Bilateral TM visualized and normal in appearance, normal external auditory canal. MMM, no oral lesions, adequate dentition. Bilateral nares within normal limits. Throat without erythema, ulcerations or exudates. no Cough on exam, no hoarseness on exam. Eyes:Pupils Equal Round Reactive to light, Extraocular movements intact,  Conjunctiva without redness, discharge or icterus. Neck/lymp/endocrine: Supple,no lymphadenopathy, no  thyromegaly CV: RRR no murmur, no edema, +2/4 P posterior tibialis pulses. no carotid bruits. No JVD Chest: CTAB, no wheeze, rhonchi or crackles. normal Respiratory effort. normal Air movement. Abd: Soft. flat. NTND. BS present. no Masses palpated. No hepatosplenomegaly. No rebound tenderness or guarding. Skin: no rashes, purpura or petechiae. Warm and well-perfused. Skin intact. Neuro/Msk:  Normal gait. PERLA. EOMi. Alert. Oriented x3.  Cranial nerves II through XII intact. Muscle strength 5/5 upper/lower extremity. DTRs equal bilaterally. Psych: Normal affect, dress and demeanor. Normal speech. Normal thought content and judgment.   Assessment/plan: Tenise Stetler is a 48 y.o. female present for est care/cpe Overweight (BMI 25.0-29.9) Routine exercise and dietary recommendations - Lipid panel Family history of colon cancer Mother has Lynch syndrome.  Patient tested negative for Lynch.  Mother had colon cancer suspected to be from Lynch syndrome. Osteoporosis,  unspecified osteoporosis type, unspecified pathological fracture presence/Vitamin D deficiency - currently supplementing with 4000 units of vitamin D.  No medications prescribed. Followed by GYN - Comp Met (CMET) - Vitamin D (25 hydroxy) - TSH Screening for deficiency anemia - CBC w/Diff Diabetes mellitus screening - Hemoglobin A1c  Encounter for preventive health examination - Comp Met (CMET) Patient was encouraged to exercise greater than 150 minutes a week. Patient was encouraged to choose a diet filled with fresh fruits and vegetables, and lean meats. AVS provided to patient today for education/recommendation on gender specific health and safety maintenance. Colonoscopy: Mother Fhx in mother at 94 (lynch syndrome) pt tested negative for lynch. Routine screen at 28  Mammogram: completed:11/2018, by GYN  Cervical cancer screening: last pap: 2018,  completed by: GYN Immunizations: tdap 2005>> updated today, Influenza UTD 2020 (encouraged yearly), zostavax at 49 Infectious disease screening: HIV completed DEXA: last completed 03/2017, osteoporosis.   Return in about 1 year (around 04/25/2020) for CPE (30 min).   Note is dictated utilizing voice recognition software. Although note has been proof read prior to signing, occasional typographical errors still can be missed. If any questions arise, please do not hesitate to call for verification.  Electronically signed by: Howard Pouch, DO [place]

## 2019-04-26 NOTE — Patient Instructions (Signed)
Please help us help you:  We are honored you have chosen [facility] for your Primary Care home. Below you will find basic instructions that you may need to access in the future. Please help us help you by reading the instructions, which cover many of the frequent questions we experience.   Prescription refills and request:  -In order to allow more efficient response time, please call your pharmacy for all refills. They will forward the request electronically to us. This allows for the quickest possible response. Request left on a nurse line can take longer to refill, since these are checked as time allows between office patients and other phone calls.  - refill request can take up to 3-5 working days to complete.  - If request is sent electronically and request is appropiate, it is usually completed in 1-2 business days.  - all patients will need to be seen routinely for all chronic medical conditions requiring prescription medications (see follow-up below). If you are overdue for follow up on your condition, you will be asked to make an appointment and we will call in enough medication to cover you until your appointment (up to 30 days).  - all controlled substances will require a face to face visit to request/refill.  - if you desire your prescriptions to go through a new pharmacy, and have an active script at original pharmacy, you will need to call your pharmacy and have scripts transferred to new pharmacy. This is completed between the pharmacy locations and not by your provider.    Results: If any images or labs were ordered, it can take up to 1 week to get results depending on the test ordered and the lab/facility running and resulting the test. - Normal or stable results, which do not need further discussion, may be released to your mychart immediately with attached note to you. A call may not be generated for normal results. Please make certain to sign up for mychart. If you have  questions on how to activate your mychart you can call the front office.  - If your results need further discussion, our office will attempt to contact you via phone, and if unable to reach you after 2 attempts, we will release your abnormal result to your mychart with instructions.  - All results will be automatically released in mychart after 1 week.  - Your provider will provide you with explanation and instruction on all relevant material in your results. Please keep in mind, results and labs may appear confusing or abnormal to the untrained eye, but it does not mean they are actually abnormal for you personally. If you have any questions about your results that are not covered, or you desire more detailed explanation than what was provided, you should make an appointment with your provider to do so.   Our office handles many outgoing and incoming calls daily. If we have not contacted you within 1 week about your results, please check your mychart to see if there is a message first and if not, then contact our office.  In helping with this matter, you help decrease call volume, and therefore allow us to be able to respond to patients needs more efficiently.   Acute office visits (sick visit):  An acute visit is intended for a new problem and are scheduled in shorter time slots to allow schedule openings for patients with new problems. This is the appropriate visit to discuss a new problem. Problems will not be addressed by   phone call or Echart message. Appointment is needed if requesting treatment. In order to provide you with excellent quality medical care with proper time for you to explain your problem, have an exam and receive treatment with instructions, these appointments should be limited to one new problem per visit. If you experience a new problem, in which you desire to be addressed, please make an acute office visit, we save openings on the schedule to accommodate you. Please do not save your  new problem for any other type of visit, let us take care of it properly and quickly for you.   Follow up visits:  Depending on your condition(s) your provider will need to see you routinely in order to provide you with quality care and prescribe medication(s). Most chronic conditions (Example: hypertension, Diabetes, depression/anxiety... etc), require visits a couple times a year. Your provider will instruct you on proper follow up for your personal medical conditions and history. Please make certain to make follow up appointments for your condition as instructed. Failing to do so could result in lapse in your medication treatment/refills. If you request a refill, and are overdue to be seen on a condition, we will always provide you with a 30 day script (once) to allow you time to schedule.    Medicare wellness (well visit): - we have a wonderful Nurse (Kim), that will meet with you and provide you will yearly medicare wellness visits. These visits should occur yearly (can not be scheduled less than 1 calendar year apart) and cover preventive health, immunizations, advance directives and screenings you are entitled to yearly through your medicare benefits. Do not miss out on your entitled benefits, this is when medicare will pay for these benefits to be ordered for you.  These are strongly encouraged by your provider and is the appropriate type of visit to make certain you are up to date with all preventive health benefits. If you have not had your medicare wellness exam in the last 12 months, please make certain to schedule one by calling the office and schedule your medicare wellness with Kim as soon as possible.   Yearly physical (well visit):  - Adults are recommended to be seen yearly for physicals. Check with your insurance and date of your last physical, most insurances require one calendar year between physicals. Physicals include all preventive health topics, screenings, medical exam and labs  that are appropriate for gender/age and history. You may have fasting labs needed at this visit. This is a well visit (not a sick visit), new problems should not be covered during this visit (see acute visit).  - Pediatric patients are seen more frequently when they are younger. Your provider will advise you on well child visit timing that is appropriate for your their age. - This is not a medicare wellness visit. Medicare wellness exams do not have an exam portion to the visit. Some medicare companies allow for a physical, some do not allow a yearly physical. If your medicare allows a yearly physical you can schedule the medicare wellness with our nurse Kim and have your physical with your provider after, on the same day. Please check with insurance for your full benefits.   Late Policy/No Shows:  - all new patients should arrive 15-30 minutes earlier than appointment to allow us time  to  obtain all personal demographics,  insurance information and for you to complete office paperwork. - All established patients should arrive 10-15 minutes earlier than appointment time to update all   information and be checked in .  - In our best efforts to run on time, if you are late for your appointment you will be asked to either reschedule or if able, we will work you back into the schedule. There will be a wait time to work you back in the schedule,  depending on availability.  - If you are unable to make it to your appointment as scheduled, please call 24 hours ahead of time to allow us to fill the time slot with someone else who needs to be seen. If you do not cancel your appointment ahead of time, you may be charged a no show fee.  Health Maintenance, Female Adopting a healthy lifestyle and getting preventive care are important in promoting health and wellness. Ask your health care provider about:  The right schedule for you to have regular tests and exams.  Things you can do on your own to prevent diseases  and keep yourself healthy. What should I know about diet, weight, and exercise? Eat a healthy diet   Eat a diet that includes plenty of vegetables, fruits, low-fat dairy products, and lean protein.  Do not eat a lot of foods that are high in solid fats, added sugars, or sodium. Maintain a healthy weight Body mass index (BMI) is used to identify weight problems. It estimates body fat based on height and weight. Your health care provider can help determine your BMI and help you achieve or maintain a healthy weight. Get regular exercise Get regular exercise. This is one of the most important things you can do for your health. Most adults should:  Exercise for at least 150 minutes each week. The exercise should increase your heart rate and make you sweat (moderate-intensity exercise).  Do strengthening exercises at least twice a week. This is in addition to the moderate-intensity exercise.  Spend less time sitting. Even light physical activity can be beneficial. Watch cholesterol and blood lipids Have your blood tested for lipids and cholesterol at 49 years of age, then have this test every 5 years. Have your cholesterol levels checked more often if:  Your lipid or cholesterol levels are high.  You are older than 49 years of age.  You are at high risk for heart disease. What should I know about cancer screening? Depending on your health history and family history, you may need to have cancer screening at various ages. This may include screening for:  Breast cancer.  Cervical cancer.  Colorectal cancer.  Skin cancer.  Lung cancer. What should I know about heart disease, diabetes, and high blood pressure? Blood pressure and heart disease  High blood pressure causes heart disease and increases the risk of stroke. This is more likely to develop in people who have high blood pressure readings, are of African descent, or are overweight.  Have your blood pressure checked: ? Every  3-5 years if you are 18-39 years of age. ? Every year if you are 40 years old or older. Diabetes Have regular diabetes screenings. This checks your fasting blood sugar level. Have the screening done:  Once every three years after age 40 if you are at a normal weight and have a low risk for diabetes.  More often and at a younger age if you are overweight or have a high risk for diabetes. What should I know about preventing infection? Hepatitis B If you have a higher risk for hepatitis B, you should be screened for this virus. Talk with your health care   provider to find out if you are at risk for hepatitis B infection. Hepatitis C Testing is recommended for:  Everyone born from 1945 through 1965.  Anyone with known risk factors for hepatitis C. Sexually transmitted infections (STIs)  Get screened for STIs, including gonorrhea and chlamydia, if: ? You are sexually active and are younger than 49 years of age. ? You are older than 49 years of age and your health care provider tells you that you are at risk for this type of infection. ? Your sexual activity has changed since you were last screened, and you are at increased risk for chlamydia or gonorrhea. Ask your health care provider if you are at risk.  Ask your health care provider about whether you are at high risk for HIV. Your health care provider may recommend a prescription medicine to help prevent HIV infection. If you choose to take medicine to prevent HIV, you should first get tested for HIV. You should then be tested every 3 months for as long as you are taking the medicine. Pregnancy  If you are about to stop having your period (premenopausal) and you may become pregnant, seek counseling before you get pregnant.  Take 400 to 800 micrograms (mcg) of folic acid every day if you become pregnant.  Ask for birth control (contraception) if you want to prevent pregnancy. Osteoporosis and menopause Osteoporosis is a disease in which  the bones lose minerals and strength with aging. This can result in bone fractures. If you are 65 years old or older, or if you are at risk for osteoporosis and fractures, ask your health care provider if you should:  Be screened for bone loss.  Take a calcium or vitamin D supplement to lower your risk of fractures.  Be given hormone replacement therapy (HRT) to treat symptoms of menopause. Follow these instructions at home: Lifestyle  Do not use any products that contain nicotine or tobacco, such as cigarettes, e-cigarettes, and chewing tobacco. If you need help quitting, ask your health care provider.  Do not use street drugs.  Do not share needles.  Ask your health care provider for help if you need support or information about quitting drugs. Alcohol use  Do not drink alcohol if: ? Your health care provider tells you not to drink. ? You are pregnant, may be pregnant, or are planning to become pregnant.  If you drink alcohol: ? Limit how much you use to 0-1 drink a day. ? Limit intake if you are breastfeeding.  Be aware of how much alcohol is in your drink. In the [place] one drink equals one 12 oz bottle of beer (355 mL), one 5 oz glass of wine (148 mL), or one 1 oz glass of hard liquor (44 mL). General instructions  Schedule regular health, dental, and eye exams.  Stay current with your vaccines.  Tell your health care provider if: ? You often feel depressed. ? You have ever been abused or do not feel safe at home. Summary  Adopting a healthy lifestyle and getting preventive care are important in promoting health and wellness.  Follow your health care provider's instructions about healthy diet, exercising, and getting tested or screened for diseases.  Follow your health care provider's instructions on monitoring your cholesterol and blood pressure. This information is not intended to replace advice given to you by your health care provider. Make sure you discuss any  questions you have with your health care provider. Document Released: 01/13/2011 Document Revised: 06/23/2018 Document   Document Reviewed: 06/23/2018 Elsevier Patient Education  [employer].

## 2019-04-27 ENCOUNTER — Telehealth: Payer: Self-pay | Admitting: Family Medicine

## 2019-04-27 MED ORDER — VITAMIN D 25 MCG (1000 UNIT) PO TABS: 2500.0000 [IU] | ORAL_TABLET | Freq: Every day | ORAL | Status: AC

## 2019-04-27 NOTE — Telephone Encounter (Signed)
Pt was called and said she is taking 5,000 units daily and will start taking half that dose. She does deny any of the symptoms listed.  She was scheduled for a F/U visit in 8 weeks and will call if she needs to be seen earlier.

## 2019-04-27 NOTE — Telephone Encounter (Signed)
Please inform patient the following information: Her labs were all normal with the exception of her vitamin D.  Her vitamin D is severely over replaced.  Vitamin D at these levels can be toxic and cause nausea/vomiting, weakness, and frequent urination in some.  At her appointment she said she takes an extra 4000 units of vitamin D a day.  She was not certain of the dose.  Please check the dose with her and I would recommend she take half of the vitamin D she is currently taking and follow-up in 8 weeks so we can discuss and recheck her levels.

## 2019-04-27 NOTE — Addendum Note (Signed)
Addended by: Caroll Rancher L on: 04/27/2019 01:41 PM   Modules accepted: Orders

## 2019-05-17 DIAGNOSIS — M810 Age-related osteoporosis without current pathological fracture: Secondary | ICD-10-CM | POA: Diagnosis not present

## 2019-05-17 DIAGNOSIS — Z01419 Encounter for gynecological examination (general) (routine) without abnormal findings: Secondary | ICD-10-CM | POA: Diagnosis not present

## 2019-05-17 DIAGNOSIS — Z6827 Body mass index (BMI) 27.0-27.9, adult: Secondary | ICD-10-CM | POA: Diagnosis not present

## 2019-05-17 DIAGNOSIS — N959 Unspecified menopausal and perimenopausal disorder: Secondary | ICD-10-CM | POA: Diagnosis not present

## 2019-06-02 DIAGNOSIS — N958 Other specified menopausal and perimenopausal disorders: Secondary | ICD-10-CM | POA: Diagnosis not present

## 2019-06-02 DIAGNOSIS — M8588 Other specified disorders of bone density and structure, other site: Secondary | ICD-10-CM | POA: Diagnosis not present

## 2019-06-02 DIAGNOSIS — Z1231 Encounter for screening mammogram for malignant neoplasm of breast: Secondary | ICD-10-CM | POA: Diagnosis not present

## 2019-06-02 LAB — HM MAMMOGRAPHY

## 2019-06-28 ENCOUNTER — Other Ambulatory Visit: Payer: Self-pay

## 2019-06-28 ENCOUNTER — Encounter: Payer: Self-pay | Admitting: Family Medicine

## 2019-06-28 ENCOUNTER — Ambulatory Visit (INDEPENDENT_AMBULATORY_CARE_PROVIDER_SITE_OTHER): Payer: BC Managed Care – PPO | Admitting: Family Medicine

## 2019-06-28 VITALS — BP 115/82 | HR 61 | Temp 98.1°F | Resp 16 | Ht 63.0 in | Wt 154.0 lb

## 2019-06-28 DIAGNOSIS — E673 Hypervitaminosis D: Secondary | ICD-10-CM | POA: Diagnosis not present

## 2019-06-28 DIAGNOSIS — M85851 Other specified disorders of bone density and structure, right thigh: Secondary | ICD-10-CM

## 2019-06-28 DIAGNOSIS — M85852 Other specified disorders of bone density and structure, left thigh: Secondary | ICD-10-CM

## 2019-06-28 NOTE — Patient Instructions (Signed)
It was great to see you today.  Hope you have a great holiday.   We will call you with lab results and guidance.

## 2019-06-28 NOTE — Progress Notes (Signed)
This visit occurred during the SARS-CoV-2 public health emergency.  Safety protocols were in place, including screening questions prior to the visit, additional usage of staff PPE, and extensive cleaning of exam room while observing appropriate contact time as indicated for disinfecting solutions.    [name] , April 23, 1971, 48 y.o., female MRN: [id number] Patient Care Team    Relationship Specialty Notifications Start End  Ma Hillock, DO PCP - General Family Medicine  04/19/19   Everlene Farrier, MD Consulting Physician Obstetrics and Gynecology  04/26/19   Teena Irani, MD (Inactive)  Gastroenterology  04/26/19     Chief Complaint  Patient presents with  . Follow-up    Recheck Vit D. Pt had bone density scan completed since last appt      Subjective: Pt presents for an OV to follow up on elevated vit d levels.  She was found to have > 120 level of vit d at her CPE 8 weeks ago. Pt was asked to decrease her vit d supplement by half. She was taking 5000u daily and reduced to 2550 u daily. She denies any symptoms and feels well. She has a h/o osteoporosis in 2018 with T score -2.7 in right femur and osteopenia in left hip. She states she had a bone density completed last month at gyn and it resulted with osteopenia only bilaterally (records requested).   Depression screen PHQ 2/9 04/26/2019  Decreased Interest 0  Down, Depressed, Hopeless 0  PHQ - 2 Score 0    No Known Allergies Social History   Social History Narrative   Marital status/children/pets: Married. 4 children.   Safety:      -smoke alarm in the home:Yes     - wears seatbelt: Yes     - Feels safe in their relationships: Yes   Past Medical History:  Diagnosis Date  . Chicken pox   . Depression   . Gall stones   . Herpes infection   . Obstructive jaundice likely due to choledocholithiasis 10/26/2011  . Osteoporosis   . PONV (postoperative nausea and vomiting)    Past Surgical History:  Procedure  Laterality Date  . ABDOMINOPLASTY  10-06-11   11-'11Tummy Tuck  . APPENDECTOMY  2004  . BREAST BIOPSY Right   . CESAREAN SECTION  10-06-11   '04/ '05  . CHOLECYSTECTOMY  10/10/2011   Procedure: LAPAROSCOPIC CHOLECYSTECTOMY WITH INTRAOPERATIVE CHOLANGIOGRAM;  Surgeon: Marcello Moores A. Cornett, MD;  Location: [facility];  Service: General;  Laterality: N/A;  Laparoscopic Cholecystectomy and Cholangiogram  . ERCP  10/25/2011   Procedure: ENDOSCOPIC RETROGRADE CHOLANGIOPANCREATOGRAPHY (ERCP);  Surgeon: Wonda Horner, MD;  Location: [facility];  Service: Endoscopy;  Laterality: N/A;  dr Penelope Coop would like to do this case @ 1100 or earlier , but not later.. Call nurse  Pls up date with time  . ERCP  10/28/2011   Procedure: ENDOSCOPIC RETROGRADE CHOLANGIOPANCREATOGRAPHY (ERCP);  Surgeon: Jeryl Columbia, MD;  Location: [facility];  Service: Endoscopy;  Laterality: N/A;  with stone extraction  . ERCP N/A 08/23/2012   Procedure: ENDOSCOPIC RETROGRADE CHOLANGIOPANCREATOGRAPHY (ERCP);  Surgeon: Missy Sabins, MD;  Location: [facility];  Service: Gastroenterology;  Laterality: N/A;  . REFRACTIVE SURGERY  2020  . TUBAL LIGATION  2010  . WISDOM TOOTH EXTRACTION     Family History  Problem Relation Age of Onset  . Colon cancer Mother 82       lynch syndrome.   . Pulmonary fibrosis Mother  idopathic  . Depression Mother   . Lung cancer Maternal Grandmother   . Lung cancer Maternal Grandfather   . ADD / ADHD Son   . Osteoporosis Paternal Grandmother   . Diabetes Paternal Grandfather   . Heart attack Paternal Grandfather   . Alcohol abuse Maternal Aunt    Allergies as of 06/28/2019   No Known Allergies     Medication List       Accurate as of June 28, 2019  8:47 AM. If you have any questions, ask your nurse or doctor.        cholecalciferol 25 MCG (1000 UT) tablet Commonly known as: VITAMIN D3 Take 2.5 tablets (2,500 Units total) by mouth daily.   Divigel 1 MG/GM Gel Generic drug: Estradiol Apply  topically daily. as directed   multivitamin with minerals tablet Take by mouth.   progesterone 100 MG capsule Commonly known as: PROMETRIUM Take 100 mg by mouth at bedtime.   valACYclovir 500 MG tablet Commonly known as: VALTREX valacyclovir 500 mg tablet  TAKE 1 TABLET BY MOUTH EVERY DAY       All past medical history, surgical history, allergies, family history, immunizations andmedications were updated in the EMR today and reviewed under the history and medication portions of their EMR.     ROS: Negative, with the exception of above mentioned in HPI   Objective:  BP 115/82 (BP Location: Left Arm, Patient Position: Sitting, Cuff Size: Normal)   Pulse 61   Temp 98.1 F (36.7 C) (Temporal)   Resp 16   Ht 5\' 3"  (1.6 m)   Wt 154 lb (69.9 kg)   SpO2 100%   BMI 27.28 kg/m  Body mass index is 27.28 kg/m. Gen: Afebrile. No acute distress. Nontoxic in appearance, well developed, well nourished.  HENT: AT. [place].  Eyes:Pupils Equal Round Reactive to light, Extraocular movements intact,  Conjunctiva without redness, discharge or icterus. Neck/lymp/endocrine: Supple,no lymphadenopathy. No thyromegaly. Anterior neck without masses or tenderness.  CV: RRR Chest: CTAB, no wheeze or crackles. Skin: no rashes, purpura or petechiae.  Neuro:  Normal gait. PERLA. EOMi. Alert. Oriented x3  No exam data present No results found. No results found for this or any previous visit (from the past 24 hour(s)).  Assessment/Plan: [name] is a 48 y.o. female present for OV for  Osteopenia of necks of both femurs/Hypervitaminosis D - pt remains asymptomatic. Decreased dose to 2500 (from 5000) for last 8 weeks. Recollect labs today to ensure returning to normal ranges and r/o parathyroid cause. If still abnormal will continue to monitor in 2 mos> until stabilized  or endocrine d/o established as cause> in which endocrine referral would be placed if indicated.  - Dexa 05/2019 at gyn did show  improvement from 2018>> now osteopenia only (was osteoporosis -2.7 in right hip). - PTH, Intact and Calcium - Vitamin D 1,25 dihydroxy - signs/sx of vit d toxicity reviewed and pt is asymptomatic.  - f/u dependent on lab results.      Reviewed expectations re: course of current medical issues.  Discussed self-management of symptoms.  Outlined signs and symptoms indicating need for more acute intervention.  Patient verbalized understanding and all questions were answered.  Patient received an After-Visit Summary.    Orders Placed This Encounter  Procedures  . PTH, Intact and Calcium  . Vitamin D 1,25 dihydroxy   > 15 minutes spent with patient, > 50% of that time face to face    Note is dictated utilizing voice  recognition software. Although note has been proof read prior to signing, occasional typographical errors still can be missed. If any questions arise, please do not hesitate to call for verification.   electronically signed by:  Howard Pouch, DO  [place]

## 2019-07-02 LAB — VITAMIN D 1,25 DIHYDROXY
Vitamin D 1, 25 (OH)2 Total: 66 pg/mL (ref 18–72)
Vitamin D2 1, 25 (OH)2: <8 pg/mL
Vitamin D3 1, 25 (OH)2: 66 pg/mL

## 2019-07-02 LAB — PTH, INTACT AND CALCIUM
Calcium: 9.2 mg/dL (ref 8.6–10.2)
PTH: 33 pg/mL (ref 14–64)

## 2019-07-07 ENCOUNTER — Encounter: Payer: Self-pay | Admitting: Family Medicine

## 2019-08-01 ENCOUNTER — Ambulatory Visit (INDEPENDENT_AMBULATORY_CARE_PROVIDER_SITE_OTHER): Payer: BC Managed Care – PPO | Admitting: Family Medicine

## 2019-08-01 ENCOUNTER — Other Ambulatory Visit: Payer: Self-pay

## 2019-08-01 ENCOUNTER — Encounter: Payer: Self-pay | Admitting: Family Medicine

## 2019-08-01 ENCOUNTER — Telehealth: Payer: Self-pay

## 2019-08-01 VITALS — BP 127/85 | HR 71 | Temp 97.9°F | Resp 16 | Ht 63.0 in | Wt 153.5 lb

## 2019-08-01 DIAGNOSIS — R319 Hematuria, unspecified: Secondary | ICD-10-CM

## 2019-08-01 DIAGNOSIS — R8290 Unspecified abnormal findings in urine: Secondary | ICD-10-CM | POA: Diagnosis not present

## 2019-08-01 DIAGNOSIS — R300 Dysuria: Secondary | ICD-10-CM

## 2019-08-01 LAB — POCT URINALYSIS DIPSTICK
Bilirubin, UA: NEGATIVE
Glucose, UA: NEGATIVE
Ketones, UA: NEGATIVE
Nitrite, UA: NEGATIVE
Protein, UA: NEGATIVE
Spec Grav, UA: 1.025 (ref 1.010–1.025)
Urobilinogen, UA: 0.2 E.U./dL
pH, UA: 6 (ref 5.0–8.0)

## 2019-08-01 MED ORDER — PHENAZOPYRIDINE HCL 100 MG PO TABS: 100.0000 mg | ORAL_TABLET | Freq: Three times a day (TID) | ORAL | 0 refills | Status: DC | PRN

## 2019-08-01 MED ORDER — NITROFURANTOIN MONOHYD MACRO 100 MG PO CAPS: 100.0000 mg | ORAL_CAPSULE | Freq: Two times a day (BID) | ORAL | 0 refills | Status: DC

## 2019-08-01 NOTE — Telephone Encounter (Signed)
Patient is having burning/urgency with urination. Patient is requesting a same day appointment with provider if possible.

## 2019-08-01 NOTE — Patient Instructions (Signed)
Hydrate- flush kidneys.  Start macrobid- twice a day for 5-7 days.   Pyridium first few days for symptoms.    Urinary Tract Infection, Adult A urinary tract infection (UTI) is an infection of any part of the urinary tract. The urinary tract includes:  The kidneys.  The ureters.  The bladder.  The urethra. These organs make, store, and get rid of pee (urine) in the body. What are the causes? This is caused by germs (bacteria) in your genital area. These germs grow and cause swelling (inflammation) of your urinary tract. What increases the risk? You are more likely to develop this condition if:  You have a small, thin tube (catheter) to drain pee.  You cannot control when you pee or poop (incontinence).  You are female, and: ? You use these methods to prevent pregnancy:  A medicine that kills sperm (spermicide).  A device that blocks sperm (diaphragm). ? You have low levels of a female hormone (estrogen). ? You are pregnant.  You have genes that add to your risk.  You are sexually active.  You take antibiotic medicines.  You have trouble peeing because of: ? A prostate that is bigger than normal, if you are female. ? A blockage in the part of your body that drains pee from the bladder (urethra). ? A kidney stone. ? A nerve condition that affects your bladder (neurogenic bladder). ? Not getting enough to drink. ? Not peeing often enough.  You have other conditions, such as: ? Diabetes. ? A weak disease-fighting system (immune system). ? Sickle cell disease. ? Gout. ? Injury of the spine. What are the signs or symptoms? Symptoms of this condition include:  Needing to pee right away (urgently).  Peeing often.  Peeing small amounts often.  Pain or burning when peeing.  Blood in the pee.  Pee that smells bad or not like normal.  Trouble peeing.  Pee that is cloudy.  Fluid coming from the vagina, if you are female.  Pain in the belly or lower  back. Other symptoms include:  Throwing up (vomiting).  No urge to eat.  Feeling mixed up (confused).  Being tired and grouchy (irritable).  A fever.  Watery poop (diarrhea). How is this treated? This condition may be treated with:  Antibiotic medicine.  Other medicines.  Drinking enough water. Follow these instructions at home:  Medicines  Take over-the-counter and prescription medicines only as told by your doctor.  If you were prescribed an antibiotic medicine, take it as told by your doctor. Do not stop taking it even if you start to feel better. General instructions  Make sure you: ? Pee until your bladder is empty. ? Do not hold pee for a long time. ? Empty your bladder after sex. ? Wipe from front to back after pooping if you are a female. Use each tissue one time when you wipe.  Drink enough fluid to keep your pee pale yellow.  Keep all follow-up visits as told by your doctor. This is important. Contact a doctor if:  You do not get better after 1-2 days.  Your symptoms go away and then come back. Get help right away if:  You have very bad back pain.  You have very bad pain in your lower belly.  You have a fever.  You are sick to your stomach (nauseous).  You are throwing up. Summary  A urinary tract infection (UTI) is an infection of any part of the urinary tract.  This condition  is caused by germs in your genital area.  There are many risk factors for a UTI. These include having a small, thin tube to drain pee and not being able to control when you pee or poop.  Treatment includes antibiotic medicines for germs.  Drink enough fluid to keep your pee pale yellow. This information is not intended to replace advice given to you by your health care provider. Make sure you discuss any questions you have with your health care provider. Document Revised: 06/17/2018 Document Reviewed: 01/07/2018 Elsevier Patient Education  2020 [employer].

## 2019-08-01 NOTE — Telephone Encounter (Signed)
Pt was called, went straight to VM. Message was left to return call.

## 2019-08-01 NOTE — Progress Notes (Signed)
This visit occurred during the SARS-CoV-2 public health emergency.  Safety protocols were in place, including screening questions prior to the visit, additional usage of staff PPE, and extensive cleaning of exam room while observing appropriate contact time as indicated for disinfecting solutions.    [name] , [birth year], 49 y.o., female MRN: [id number] Patient Care Team    Relationship Specialty Notifications Start End  Ma Hillock, DO PCP - General Family Medicine  04/19/19   Everlene Farrier, MD Consulting Physician Obstetrics and Gynecology  04/26/19   Teena Irani, MD (Inactive)  Gastroenterology  04/26/19     Chief Complaint  Patient presents with  . Urinary Tract Infection    Pt is having frequency and pain with urination since Saturday      Subjective: Pt presents for an OV with complaints of painful urination and urinary frequency of 3 days duration.  Associated symptoms include bladder spasms.  She does not have a history of kidney stones.  She does have a history of frequent UTIs, although she reports she has not had one in approximately 5 years.  Prior to then she was seeing a urologist for very frequent UTIs.  She reports a complete work-up was completed and no specific cause identified.  She states she does try to hydrate and make sure to urinate after sexual intercourse.  She denies fever, chills, nausea, vomit or lower back pain. No LMP recorded. Patient has had an ablation. .   Depression screen PHQ 2/9 04/26/2019  Decreased Interest 0  Down, Depressed, Hopeless 0  PHQ - 2 Score 0    No Known Allergies Social History   Social History Narrative   Marital status/children/pets: Married. 4 children.   Safety:      -smoke alarm in the home:Yes     - wears seatbelt: Yes     - Feels safe in their relationships: Yes   Past Medical History:  Diagnosis Date  . Chicken pox   . Depression   . Gall stones   . Herpes infection   . Obstructive jaundice  likely due to choledocholithiasis 10/26/2011  . Osteoporosis   . PONV (postoperative nausea and vomiting)    Past Surgical History:  Procedure Laterality Date  . ABDOMINOPLASTY  10-06-11   11-'11Tummy Tuck  . APPENDECTOMY  2004  . BREAST BIOPSY Right   . CESAREAN SECTION  10-06-11   '04/ '05  . CHOLECYSTECTOMY  10/10/2011   Procedure: LAPAROSCOPIC CHOLECYSTECTOMY WITH INTRAOPERATIVE CHOLANGIOGRAM;  Surgeon: Marcello Moores A. Cornett, MD;  Location: [facility];  Service: General;  Laterality: N/A;  Laparoscopic Cholecystectomy and Cholangiogram  . ERCP  10/25/2011   Procedure: ENDOSCOPIC RETROGRADE CHOLANGIOPANCREATOGRAPHY (ERCP);  Surgeon: Wonda Horner, MD;  Location: [facility];  Service: Endoscopy;  Laterality: N/A;  dr Penelope Coop would like to do this case @ 1100 or earlier , but not later.. Call nurse  Pls up date with time  . ERCP  10/28/2011   Procedure: ENDOSCOPIC RETROGRADE CHOLANGIOPANCREATOGRAPHY (ERCP);  Surgeon: Jeryl Columbia, MD;  Location: [facility];  Service: Endoscopy;  Laterality: N/A;  with stone extraction  . ERCP N/A 08/23/2012   Procedure: ENDOSCOPIC RETROGRADE CHOLANGIOPANCREATOGRAPHY (ERCP);  Surgeon: Missy Sabins, MD;  Location: [facility];  Service: Gastroenterology;  Laterality: N/A;  . REFRACTIVE SURGERY  2020  . TUBAL LIGATION  2010  . WISDOM TOOTH EXTRACTION     Family History  Problem Relation Age of Onset  . Colon cancer Mother 27  lynch syndrome.   . Pulmonary fibrosis Mother        idopathic  . Depression Mother   . Lung cancer Maternal Grandmother   . Lung cancer Maternal Grandfather   . ADD / ADHD Son   . Osteoporosis Paternal Grandmother   . Diabetes Paternal Grandfather   . Heart attack Paternal Grandfather   . Alcohol abuse Maternal Aunt    Allergies as of 08/01/2019   No Known Allergies     Medication List       Accurate as of August 01, 2019  5:35 PM. If you have any questions, ask your nurse or doctor.        cholecalciferol 25 MCG (1000 UNIT)  tablet Commonly known as: VITAMIN D3 Take 2.5 tablets (2,500 Units total) by mouth daily.   Divigel 1 MG/GM Gel Generic drug: Estradiol Apply topically daily. as directed   multivitamin with minerals tablet Take by mouth.   nitrofurantoin (macrocrystal-monohydrate) 100 MG capsule Commonly known as: Macrobid Take 1 capsule (100 mg total) by mouth 2 (two) times daily. Started by: Felix Pacini, DO   phenazopyridine 100 MG tablet Commonly known as: Pyridium Take 1 tablet (100 mg total) by mouth 3 (three) times daily as needed for pain. Started by: Felix Pacini, DO   progesterone 100 MG capsule Commonly known as: PROMETRIUM Take 100 mg by mouth at bedtime.   valACYclovir 500 MG tablet Commonly known as: VALTREX valacyclovir 500 mg tablet  TAKE 1 TABLET BY MOUTH EVERY DAY       All past medical history, surgical history, allergies, family history, immunizations andmedications were updated in the EMR today and reviewed under the history and medication portions of their EMR.     ROS: Negative, with the exception of above mentioned in HPI   Objective:  BP 127/85 (BP Location: Right Arm, Patient Position: Sitting, Cuff Size: Normal)   Pulse 71   Temp 97.9 F (36.6 C) (Temporal)   Resp 16   Ht 5\' 3"  (1.6 m)   Wt 153 lb 8 oz (69.6 kg)   SpO2 99%   BMI 27.19 kg/m  Body mass index is 27.19 kg/m. Gen: Afebrile. No acute distress. Nontoxic in appearance, well developed, well nourished.  HENT: AT. [place].  CV: RRR  Abd: Soft.  NTND. BS present. no Masses palpated. No rebound or guarding.  MSK: no cva tenderness bilateral.  Neuro: Normal gait. PERLA. EOMi. Alert. Oriented x3  Psych: Normal affect, dress and demeanor. Normal speech. Normal thought content and judgment.  No exam data present No results found. Results for orders placed or performed in visit on 08/01/19 (from the past 24 hour(s))  POCT urinalysis dipstick     Status: Abnormal   Collection Time: 08/01/19  3:49 PM    Result Value Ref Range   Color, UA yellow    Clarity, UA clear    Glucose, UA Negative Negative   Bilirubin, UA negative    Ketones, UA negative    Spec Grav, UA 1.025 1.010 - 1.025   Blood, UA 3+    pH, UA 6.0 5.0 - 8.0   Protein, UA Negative Negative   Urobilinogen, UA 0.2 0.2 or 1.0 E.U./dL   Nitrite, UA negative    Leukocytes, UA Small (1+) (A) Negative   Appearance     Odor yes     Assessment/Plan: [name] is a 49 y.o. female present for OV for  Burning with urination/abnormal urine/hematuria -Rest.  Hydrate. -Elected to start 54  twice daily 5-7 days.  Patient recalls that this was the medication she used to be prescribed for her UTIs.  Will send for urine culture to ensure infection and sensitivities. -Pyridium prescribed for comfort.  Patient was warned of urine discoloration and staining after using Pyridium - POCT urinalysis dipstick: Positive for 3+ blood and positive leuks.  Negative nitrites. - Urine Culture sent to be complete.  Patient will be called with results and treatment plan altered depending upon sensitivities. -Follow-up as needed, or if symptoms are worsening.    Reviewed expectations re: course of current medical issues.  Discussed self-management of symptoms.  Outlined signs and symptoms indicating need for more acute intervention.  Patient verbalized understanding and all questions were answered.  Patient received an After-Visit Summary.    Orders Placed This Encounter  Procedures  . Urine Culture  . POCT urinalysis dipstick   Meds ordered this encounter  Medications  . nitrofurantoin, macrocrystal-monohydrate, (MACROBID) 100 MG capsule    Sig: Take 1 capsule (100 mg total) by mouth 2 (two) times daily.    Dispense:  14 capsule    Refill:  0  . phenazopyridine (PYRIDIUM) 100 MG tablet    Sig: Take 1 tablet (100 mg total) by mouth 3 (three) times daily as needed for pain.    Dispense:  10 tablet    Refill:  0    Note  is dictated utilizing voice recognition software. Although note has been proof read prior to signing, occasional typographical errors still can be missed. If any questions arise, please do not hesitate to call for verification.   electronically signed by:  Felix Pacini, DO   Primary Care - OR

## 2019-08-01 NOTE — Telephone Encounter (Signed)
Pt returned call and was given acute visit at 3:45, okay per Dr Claiborne Billings. Pt verbalized understanding

## 2019-08-03 ENCOUNTER — Telehealth: Payer: Self-pay | Admitting: Family Medicine

## 2019-08-03 LAB — URINE CULTURE
MICRO NUMBER:: 10052578
SPECIMEN QUALITY:: ADEQUATE

## 2019-08-03 MED ORDER — SULFAMETHOXAZOLE-TRIMETHOPRIM 800-160 MG PO TABS: 1.0000 | ORAL_TABLET | Freq: Two times a day (BID) | ORAL | 0 refills | Status: DC

## 2019-08-03 NOTE — Telephone Encounter (Signed)
Pt was called and went straight to VM. Pt was told my chart message would be sent or she could return call with instructions for her. My chart message sent to patient

## 2019-08-03 NOTE — Telephone Encounter (Signed)
Please call patient: Her urine culture grew Klebsiella infection.  Unfortunately Macrobid does not work for Klebsiella.  I have called in Bactrim twice daily for 5 days.  She should discontinue the Macrobid and start the Bactrim ASAP.

## 2019-08-08 DIAGNOSIS — Z20822 Contact with and (suspected) exposure to covid-19: Secondary | ICD-10-CM | POA: Diagnosis not present

## 2019-10-13 ENCOUNTER — Ambulatory Visit: Payer: BC Managed Care – PPO | Attending: Internal Medicine

## 2019-10-13 DIAGNOSIS — Z23 Encounter for immunization: Secondary | ICD-10-CM

## 2019-10-13 NOTE — Progress Notes (Signed)
   Covid-19 Vaccination Clinic  Name:  Keyoni Lapinski    MRN: [id number] DOB: April 22, 1971  10/13/2019  Ms. Browder was observed post Covid-19 immunization for 15 minutes without incident. She was provided with Vaccine Information Sheet and instruction to access the V-Safe system.   Ms. Mccutchen was instructed to call 911 with any severe reactions post vaccine: Marland Kitchen Difficulty breathing  . Swelling of face and throat  . A fast heartbeat  . A bad rash all over body  . Dizziness and weakness   Immunizations Administered    Name Date Dose VIS Date Route   Pfizer COVID-19 Vaccine 10/13/2019  2:01 PM 0.3 mL 06/24/2019 Intramuscular   Manufacturer: [employer], [employer]   Lot: [id number]   NDC: [address]-0    pt reported no issues

## 2019-11-07 ENCOUNTER — Ambulatory Visit: Payer: BC Managed Care – PPO | Attending: Internal Medicine

## 2019-11-07 DIAGNOSIS — Z23 Encounter for immunization: Secondary | ICD-10-CM

## 2019-11-07 NOTE — Progress Notes (Signed)
   Covid-19 Vaccination Clinic  Name:  Khushbu Pippen    MRN: [id number] DOB: [birth year]  11/07/2019  Ms. Spells was observed post Covid-19 immunization for 15 minutes without incident. She was provided with Vaccine Information Sheet and instruction to access the V-Safe system.   Ms. Bleau was instructed to call 911 with any severe reactions post vaccine: Marland Kitchen Difficulty breathing  . Swelling of face and throat  . A fast heartbeat  . A bad rash all over body  . Dizziness and weakness   Immunizations Administered    Name Date Dose VIS Date Route   Pfizer COVID-19 Vaccine 11/07/2019  1:54 PM 0.3 mL 09/07/2018 Intramuscular   Manufacturer: [employer], [employer]   Lot: [id number]   NDC: [address]-4

## 2020-02-06 DIAGNOSIS — L719 Rosacea, unspecified: Secondary | ICD-10-CM | POA: Diagnosis not present

## 2020-02-06 DIAGNOSIS — L821 Other seborrheic keratosis: Secondary | ICD-10-CM | POA: Diagnosis not present

## 2020-02-06 DIAGNOSIS — D225 Melanocytic nevi of trunk: Secondary | ICD-10-CM | POA: Diagnosis not present

## 2020-02-06 DIAGNOSIS — L814 Other melanin hyperpigmentation: Secondary | ICD-10-CM | POA: Diagnosis not present

## 2020-05-21 DIAGNOSIS — Z131 Encounter for screening for diabetes mellitus: Secondary | ICD-10-CM | POA: Diagnosis not present

## 2020-05-21 DIAGNOSIS — Z1231 Encounter for screening mammogram for malignant neoplasm of breast: Secondary | ICD-10-CM | POA: Diagnosis not present

## 2020-05-21 DIAGNOSIS — Z13228 Encounter for screening for other metabolic disorders: Secondary | ICD-10-CM | POA: Diagnosis not present

## 2020-05-21 DIAGNOSIS — Z13220 Encounter for screening for lipoid disorders: Secondary | ICD-10-CM | POA: Diagnosis not present

## 2020-05-21 DIAGNOSIS — Z1321 Encounter for screening for nutritional disorder: Secondary | ICD-10-CM | POA: Diagnosis not present

## 2020-05-21 DIAGNOSIS — Z6828 Body mass index (BMI) 28.0-28.9, adult: Secondary | ICD-10-CM | POA: Diagnosis not present

## 2020-05-21 DIAGNOSIS — Z1329 Encounter for screening for other suspected endocrine disorder: Secondary | ICD-10-CM | POA: Diagnosis not present

## 2020-05-21 DIAGNOSIS — Z01419 Encounter for gynecological examination (general) (routine) without abnormal findings: Secondary | ICD-10-CM | POA: Diagnosis not present

## 2020-06-30 DIAGNOSIS — R350 Frequency of micturition: Secondary | ICD-10-CM | POA: Diagnosis not present

## 2020-06-30 DIAGNOSIS — N3001 Acute cystitis with hematuria: Secondary | ICD-10-CM | POA: Diagnosis not present

## 2020-08-13 ENCOUNTER — Ambulatory Visit: Payer: Self-pay

## 2020-08-13 ENCOUNTER — Ambulatory Visit (INDEPENDENT_AMBULATORY_CARE_PROVIDER_SITE_OTHER): Payer: BC Managed Care – PPO | Admitting: Orthopaedic Surgery

## 2020-08-13 ENCOUNTER — Other Ambulatory Visit: Payer: Self-pay

## 2020-08-13 ENCOUNTER — Encounter: Payer: Self-pay | Admitting: Orthopaedic Surgery

## 2020-08-13 VITALS — BP 127/84 | Ht 63.0 in | Wt 150.0 lb

## 2020-08-13 DIAGNOSIS — M545 Low back pain, unspecified: Secondary | ICD-10-CM

## 2020-08-13 DIAGNOSIS — M5136 Other intervertebral disc degeneration, lumbar region: Secondary | ICD-10-CM

## 2020-08-13 MED ORDER — METHOCARBAMOL 500 MG PO TABS: 500.0000 mg | ORAL_TABLET | Freq: Three times a day (TID) | ORAL | 0 refills | Status: DC | PRN

## 2020-08-13 MED ORDER — METHYLPREDNISOLONE 4 MG PO TABS: ORAL_TABLET | ORAL | 0 refills | Status: DC

## 2020-08-13 MED ORDER — TRAMADOL HCL 50 MG PO TABS: 50.0000 mg | ORAL_TABLET | Freq: Two times a day (BID) | ORAL | 0 refills | Status: DC | PRN

## 2020-08-13 NOTE — Progress Notes (Signed)
Per Zonia Kief, PA patient was given an IM injection of Toradol 30mg /ml in the right glute and Depo Medrol 80mg /ml in the left glute without any complications.

## 2020-08-13 NOTE — Progress Notes (Signed)
Office Visit Note   Patient: [name]           Date of Birth: 1971/06/12           MRN: [id number] Visit Date: 08/13/2020              Requested by: Natalia Leatherwood, DO [address] [place],  [place] [address] PCP: Natalia Leatherwood, DO   Assessment & Plan: Visit Diagnoses:  1. Acute right-sided low back pain, unspecified whether sciatica present     Plan:  In office given patient pain today in the clinic she was given Toradol 30 mg and Depo-Medrol 80 mg IM injections.  Also sent in prescriptions for Medrol Dosepak 6-day taper to be taken as directed, Robaxin 500 mg every 8 hours as needed for spasms and Ultram 50 mg every 12 hours as needed for pain.  Advised patient to hold off on her [place] workouts until we can get her symptoms resolved.  Patient will follow up with me in 2 weeks for recheck but I did ask her to call me in 1 week to limit how she is feeling.  If she has not had any improvement at that point with the medications I will go ahead and get lumbar MRI to rule out HNP/stenosis.  She has already failed conservative treatment with multiple chiropractic visits over the last few weeks and also with taking Aleve and Advil.  All questions answered.     Follow-Up Instructions: Return in about 2 weeks (around 08/27/2020) for with Lonzie Simmer recheck right sciatica.   Orders:  Orders Placed This Encounter  Procedures  . XR Lumbar Spine 2-3 Views   Meds ordered this encounter  Medications  . methylPREDNISolone (MEDROL) 4 MG tablet    Sig: 6 day taper to be taken as directed.    Dispense:  21 tablet    Refill:  0  . methocarbamol (ROBAXIN) 500 MG tablet    Sig: Take 1 tablet (500 mg total) by mouth every 8 (eight) hours as needed for muscle spasms.    Dispense:  40 tablet    Refill:  0  . traMADol (ULTRAM) 50 MG tablet    Sig: Take 1 tablet (50 mg total) by mouth every 12 (twelve) hours as needed.    Dispense:  30 tablet    Refill:  0      Procedures: No procedures  performed   Clinical Data: No additional findings.   Subjective: Chief Complaint  Patient presents with  . Right Leg - Pain    Onset 07/11/2020    HPI 50 year old white female who is new patient to clinic comes in today with complaints of right buttock and hamstring pain.  Patient states that symptoms started around July 11, 2020 after she had been doing her normal [place] workout.  States that she has been doing these exercises for at least 5 years.  No specific injury that she can recall but states that the next day after her workout she began having pain in the right buttock area that extended down the back of her leg to her knee.  No problems of this nature before onset.  Patient states that she went to a chiropractor for 3 weeks and had at least 5 adjustments without any improvement.  States the chiropractor never ordered x-rays.  She has not taken Aleve and Advil without any improvement.  Describes pain as being constant at times.  Has had a couple of episodes where she  felt like her right leg was giving out.  Pain worse when she is sitting.  Some improvement with standing.  No left leg symptoms.  No complaints of bowel or bladder incontinence. Review of Systems No current cardiac pulmonary GI GU issues  Objective: Vital Signs: BP 127/84   Ht 5\' 3"  (1.6 m)   Wt 150 lb (68 kg)   BMI 26.57 kg/m   Physical Exam Constitutional:      Comments: Very pleasant white female alert and oriented in no acute distress.  HENT:     Head: Normocephalic.  Eyes:     Extraocular Movements: Extraocular movements intact.     Pupils: Pupils are equal, round, and reactive to light.  Pulmonary:     Effort: No respiratory distress.  Musculoskeletal:     Comments: Gait is normal.  Heel and toe gait without difficulty.  She has pain with lumbar extension.  Lumbar flexion hands to mid thigh with discomfort.  Moderate right-sided notch tenderness.  Negative on the left side.  Bilateral hip greater  trochanters nontender.  Negative logroll bilateral hips.  Positive right straight leg raise.  Negative on the left side.  Question trace right anterior tib and gastroc weakness but patient also does have pain with this maneuver so somewhat difficult to tell if this is true weakness.  Left leg strong.  Bilateral calves nontender.  Skin:    General: Skin is warm and dry.  Neurological:     Mental Status: She is alert and oriented to person, place, and time.  Psychiatric:        Mood and Affect: Mood normal.     Ortho Exam  Specialty Comments:  No specialty comments available.  Imaging: No results found.   PMFS History: Patient Active Problem List   Diagnosis Date Noted  . Hypervitaminosis D 06/28/2019  . Overweight (BMI 25.0-29.9) 04/26/2019  . Osteopenia 04/26/2019  . Family history of colon cancer 10/02/2011   Past Medical History:  Diagnosis Date  . Chicken pox   . Depression   . Gall stones   . Herpes infection   . Obstructive jaundice likely due to choledocholithiasis 10/26/2011  . Osteoporosis   . PONV (postoperative nausea and vomiting)     Family History  Problem Relation Age of Onset  . Colon cancer Mother 87       lynch syndrome.   . Pulmonary fibrosis Mother        idopathic  . Depression Mother   . Lung cancer Maternal Grandmother   . Lung cancer Maternal Grandfather   . ADD / ADHD Son   . Osteoporosis Paternal Grandmother   . Diabetes Paternal Grandfather   . Heart attack Paternal Grandfather   . Alcohol abuse Maternal Aunt     Past Surgical History:  Procedure Laterality Date  . ABDOMINOPLASTY  10-06-11   11-'11Tummy Tuck  . APPENDECTOMY  2004  . BREAST BIOPSY Right   . CESAREAN SECTION  10-06-11   '04/ '05  . CHOLECYSTECTOMY  10/10/2011   Procedure: LAPAROSCOPIC CHOLECYSTECTOMY WITH INTRAOPERATIVE CHOLANGIOGRAM;  Surgeon: 10/12/2011 A. Cornett, MD;  Location: [facility];  Service: General;  Laterality: N/A;  Laparoscopic Cholecystectomy and Cholangiogram   . ERCP  10/25/2011   Procedure: ENDOSCOPIC RETROGRADE CHOLANGIOPANCREATOGRAPHY (ERCP);  Surgeon: 10/27/2011, MD;  Location: [facility];  Service: Endoscopy;  Laterality: N/A;  dr [facility] would like to do this case @ 1100 or earlier , but not later.. Call nurse  Pls up date with  time  . ERCP  10/28/2011   Procedure: ENDOSCOPIC RETROGRADE CHOLANGIOPANCREATOGRAPHY (ERCP);  Surgeon: Petra Kuba, MD;  [facility];  Service: Endoscopy;  Laterality: N/A;  with stone extraction  . ERCP N/A 08/23/2012   Procedure: ENDOSCOPIC RETROGRADE CHOLANGIOPANCREATOGRAPHY (ERCP);  Surgeon: Barrie Folk, MD;  Location: [facility];  Service: Gastroenterology;  Laterality: N/A;  . REFRACTIVE SURGERY  2020  . TUBAL LIGATION  2010  . WISDOM TOOTH EXTRACTION     Social History   Occupational History  . Not on file  Tobacco Use  . Smoking status: Never Smoker  . Smokeless tobacco: Never Used  Vaping Use  . Vaping Use: Never used  Substance and Sexual Activity  . Alcohol use: Yes    Comment: socially   . Drug use: No  . Sexual activity: Yes

## 2020-08-17 ENCOUNTER — Telehealth: Payer: Self-pay | Admitting: Surgery

## 2020-08-17 DIAGNOSIS — M629 Disorder of muscle, unspecified: Secondary | ICD-10-CM | POA: Diagnosis not present

## 2020-08-17 DIAGNOSIS — N390 Urinary tract infection, site not specified: Secondary | ICD-10-CM | POA: Diagnosis not present

## 2020-08-17 DIAGNOSIS — M545 Low back pain, unspecified: Secondary | ICD-10-CM

## 2020-08-17 DIAGNOSIS — R309 Painful micturition, unspecified: Secondary | ICD-10-CM | POA: Diagnosis not present

## 2020-08-17 NOTE — Telephone Encounter (Signed)
Please schedule lumbar mri w/out contrast. R/O HNP/stenosis.  Can f/u with next week to review.  She may have a large rupture.

## 2020-08-17 NOTE — Telephone Encounter (Signed)
MRI order entered. Sabrina advised Urgent per Fayrene Fearing.  Patient aware.

## 2020-08-17 NOTE — Addendum Note (Signed)
Addended by: Rogers Seeds on: 08/17/2020 03:48 PM   Modules accepted: Orders

## 2020-08-17 NOTE — Telephone Encounter (Signed)
Patient called requesting a call back from [facility]. Patient state PA Fayrene Fearing informed her to call office if condition still bothering her. Please call patient at [phone].

## 2020-08-17 NOTE — Telephone Encounter (Signed)
Please see below and advise.

## 2020-08-22 ENCOUNTER — Other Ambulatory Visit: Payer: Self-pay | Admitting: Surgery

## 2020-08-22 ENCOUNTER — Other Ambulatory Visit: Payer: Self-pay

## 2020-08-22 ENCOUNTER — Ambulatory Visit
Admission: RE | Admit: 2020-08-22 | Discharge: 2020-08-22 | Disposition: A | Discharge status: Home / Self Care | Payer: BC Managed Care – PPO | Source: Ambulatory Visit | Attending: Surgery | Admitting: Surgery

## 2020-08-22 DIAGNOSIS — M545 Low back pain, unspecified: Secondary | ICD-10-CM

## 2020-08-28 ENCOUNTER — Ambulatory Visit: Payer: BC Managed Care – PPO | Admitting: Orthopaedic Surgery

## 2020-08-29 ENCOUNTER — Ambulatory Visit (INDEPENDENT_AMBULATORY_CARE_PROVIDER_SITE_OTHER): Payer: BC Managed Care – PPO | Admitting: Orthopaedic Surgery

## 2020-08-29 ENCOUNTER — Encounter: Payer: Self-pay | Admitting: Orthopaedic Surgery

## 2020-08-29 DIAGNOSIS — M5126 Other intervertebral disc displacement, lumbar region: Secondary | ICD-10-CM | POA: Diagnosis not present

## 2020-08-29 NOTE — Progress Notes (Signed)
Office Visit Note   Patient: [name]           Date of Birth: 1971/02/11           MRN: [id number] Visit Date: 08/29/2020              Requested by: Natalia Leatherwood, DO [address] [place],  [place] [address] PCP: Natalia Leatherwood, DO   Assessment & Plan: Visit Diagnoses:  1. Protrusion of lumbar intervertebral disc     Plan: Patient has disc desiccation of the bone to lumbar region.  L5-S1 has large right subarticular disc protrusion with severe lateral recess stenosis but is gotten significantly better.  I will recheck her in 6 weeks.  We reviewed MRI images and I gave her a copy of the report.  We discussed treatment options if her symptoms recur or progress.  Currently she is doing well and can gradually resume workout activity as long as it does not cause pain.  Follow-Up Instructions: Return in about 6 weeks (around 10/10/2020).   Orders:  No orders of the defined types were placed in this encounter.  No orders of the defined types were placed in this encounter.     Procedures: No procedures performed   Clinical Data: No additional findings.   Subjective: Chief Complaint  Patient presents with  . Lower Back - Pain    HPI 50 year old female seen for follow-up of right leg pain.  She likes to participate in [employer] is a workout and was treated with the intra-muscular Depo injection Medrol Dosepak and muscle relaxant for severe right leg pain and weakness.  She states her last 6 weeks she is actually gotten better.  She is ambulatory without leg pain.  No fever chills she denies bowel bladder symptoms MRI scan has been obtained and is available for review she is back at work.  Review of Systems updated unchanged.   Objective: Vital Signs: BP 133/67   Pulse 82   Ht 5\' 3"  (1.6 m)   Wt 150 lb (68 kg)   BMI 26.57 kg/m   Physical Exam Constitutional:      Appearance: She is well-developed.  HENT:     Head: Normocephalic.     Right Ear: External ear  normal.     Left Ear: External ear normal.  Eyes:     Pupils: Pupils are equal, round, and reactive to light.  Neck:     Thyroid: No thyromegaly.     Trachea: No tracheal deviation.  Cardiovascular:     Rate and Rhythm: Normal rate.  Pulmonary:     Effort: Pulmonary effort is normal.  Abdominal:     Palpations: Abdomen is soft.  Skin:    General: Skin is warm and dry.  Neurological:     Mental Status: She is alert and oriented to person, place, and time.  Psychiatric:        Mood and Affect: Mood and affect normal.        Behavior: Behavior normal.     Ortho Exam patient's pains at positive straight leg raising on the right 80 degrees minimal sciatic notch tenderness negative popliteal compression test.  She is able to heel and toe walk and can get rapidly from sitting to standing.  No limp no weakness no atrophy normal distal pulses. Specialty Comments:  No specialty comments available.  Imaging: CLINICAL DATA:  Low back pain, rule out stenosis. Right leg, right buttock pain.  EXAM: MRI LUMBAR  SPINE WITHOUT CONTRAST  TECHNIQUE: Multiplanar, multisequence MR imaging of the lumbar spine was performed. No intravenous contrast was administered.  COMPARISON:  August 13, 2020.  FINDINGS: Segmentation: The inferior-most fully formed intervertebral disc labeled L5-S1.  Alignment:  Normal.  Vertebrae: Vertebral body heights are maintained. Scattered vertebral venous malformations.  Conus medullaris and cauda equina: Conus extends to the L1-L2 level. Conus appears normal.  Paraspinal and other soft tissues: Unremarkable.  Disc levels:  T12-L1: No significant disc protrusion, foraminal stenosis, or canal stenosis.  L1-L2: No significant disc protrusion, foraminal stenosis, or canal stenosis.  L2-L3: No significant disc protrusion, foraminal stenosis, or canal stenosis.  L3-L4: No significant disc protrusion, foraminal stenosis, or  canal stenosis.  L4-L5: Broad-based disc bulge with superimposed central disc protrusion. Moderate left and mild right subarticular recess stenosis. Mild central canal stenosis. No significant foraminal stenosis.  L5-S1: Small broad-based disc bulge. Superimposed large right subarticular disc protrusion with resulting severe right subarticular recess stenosis and suspected impingement of the descending right S1 nerve root. No significant central canal stenosis or foraminal stenosis.  IMPRESSION: 1. At L5-S1 there is a large right subarticular disc protrusion with resulting severe right subarticular recess stenosis and suspected impingement of the descending right S1 nerve root. 2. At L4-L5 there is mild central canal stenosis with moderate left and mild right subarticular recess stenosis.   Electronically Signed   By: Feliberto Harts MD   On: 08/22/2020 08:02   PMFS History: Patient Active Problem List   Diagnosis Date Noted  . Protrusion of lumbar intervertebral disc 08/29/2020  . Hypervitaminosis D 06/28/2019  . Overweight (BMI 25.0-29.9) 04/26/2019  . Osteopenia 04/26/2019  . Family history of colon cancer 10/02/2011   Past Medical History:  Diagnosis Date  . Chicken pox   . Depression   . Gall stones   . Herpes infection   . Obstructive jaundice likely due to choledocholithiasis 10/26/2011  . Osteoporosis   . PONV (postoperative nausea and vomiting)     Family History  Problem Relation Age of Onset  . Colon cancer Mother 36       lynch syndrome.   . Pulmonary fibrosis Mother        idopathic  . Depression Mother   . Lung cancer Maternal Grandmother   . Lung cancer Maternal Grandfather   . ADD / ADHD Son   . Osteoporosis Paternal Grandmother   . Diabetes Paternal Grandfather   . Heart attack Paternal Grandfather   . Alcohol abuse Maternal Aunt     Past Surgical History:  Procedure Laterality Date  . ABDOMINOPLASTY  10-06-11   11-'11Tummy Tuck   . APPENDECTOMY  2004  . BREAST BIOPSY Right   . CESAREAN SECTION  10-06-11   '04/ '05  . CHOLECYSTECTOMY  10/10/2011   Procedure: LAPAROSCOPIC CHOLECYSTECTOMY WITH INTRAOPERATIVE CHOLANGIOGRAM;  Surgeon: Maisie Fus A. Cornett, MD;  Location: [facility];  Service: General;  Laterality: N/A;  Laparoscopic Cholecystectomy and Cholangiogram  . ERCP  10/25/2011   Procedure: ENDOSCOPIC RETROGRADE CHOLANGIOPANCREATOGRAPHY (ERCP);  Surgeon: Graylin Shiver, MD;  Location: [facility];  Service: Endoscopy;  Laterality: N/A;  dr Evette Cristal would like to do this case @ 1100 or earlier , but not later.. Call nurse  Pls up date with time  . ERCP  10/28/2011   Procedure: ENDOSCOPIC RETROGRADE CHOLANGIOPANCREATOGRAPHY (ERCP);  Surgeon: Petra Kuba, MD;  Location: [facility];  Service: Endoscopy;  Laterality: N/A;  with stone extraction  . ERCP N/A 08/23/2012   Procedure:  ENDOSCOPIC RETROGRADE CHOLANGIOPANCREATOGRAPHY (ERCP);  Surgeon: Barrie Folk, MD;  Location: [facility] OR;  Service: Gastroenterology;  Laterality: N/A;  . REFRACTIVE SURGERY  2020  . TUBAL LIGATION  2010  . WISDOM TOOTH EXTRACTION     Social History   Occupational History  . Not on file  Tobacco Use  . Smoking status: Never Smoker  . Smokeless tobacco: Never Used  Vaping Use  . Vaping Use: Never used  Substance and Sexual Activity  . Alcohol use: Yes    Comment: socially   . Drug use: No  . Sexual activity: Yes

## 2020-09-03 DIAGNOSIS — M6281 Muscle weakness (generalized): Secondary | ICD-10-CM | POA: Diagnosis not present

## 2020-09-03 DIAGNOSIS — M6289 Other specified disorders of muscle: Secondary | ICD-10-CM | POA: Diagnosis not present

## 2020-09-03 DIAGNOSIS — M62838 Other muscle spasm: Secondary | ICD-10-CM | POA: Diagnosis not present

## 2020-10-10 ENCOUNTER — Ambulatory Visit: Payer: BC Managed Care – PPO | Admitting: Surgery

## 2021-02-12 DIAGNOSIS — L821 Other seborrheic keratosis: Secondary | ICD-10-CM | POA: Diagnosis not present

## 2021-02-12 DIAGNOSIS — L719 Rosacea, unspecified: Secondary | ICD-10-CM | POA: Diagnosis not present

## 2021-02-12 DIAGNOSIS — L814 Other melanin hyperpigmentation: Secondary | ICD-10-CM | POA: Diagnosis not present

## 2021-02-12 DIAGNOSIS — D225 Melanocytic nevi of trunk: Secondary | ICD-10-CM | POA: Diagnosis not present

## 2021-05-16 DIAGNOSIS — H10811 Pingueculitis, right eye: Secondary | ICD-10-CM | POA: Diagnosis not present

## 2021-05-17 DIAGNOSIS — R300 Dysuria: Secondary | ICD-10-CM | POA: Diagnosis not present

## 2021-05-17 DIAGNOSIS — N390 Urinary tract infection, site not specified: Secondary | ICD-10-CM | POA: Diagnosis not present

## 2021-06-04 DIAGNOSIS — Z113 Encounter for screening for infections with a predominantly sexual mode of transmission: Secondary | ICD-10-CM | POA: Diagnosis not present

## 2021-06-04 DIAGNOSIS — Z1231 Encounter for screening mammogram for malignant neoplasm of breast: Secondary | ICD-10-CM | POA: Diagnosis not present

## 2021-06-04 DIAGNOSIS — N959 Unspecified menopausal and perimenopausal disorder: Secondary | ICD-10-CM | POA: Diagnosis not present

## 2021-06-04 DIAGNOSIS — Z6827 Body mass index (BMI) 27.0-27.9, adult: Secondary | ICD-10-CM | POA: Diagnosis not present

## 2021-06-04 DIAGNOSIS — Z01419 Encounter for gynecological examination (general) (routine) without abnormal findings: Secondary | ICD-10-CM | POA: Diagnosis not present

## 2022-04-08 ENCOUNTER — Encounter: Payer: Self-pay | Admitting: Surgery

## 2022-04-08 ENCOUNTER — Ambulatory Visit: Payer: Self-pay

## 2022-04-08 ENCOUNTER — Ambulatory Visit (INDEPENDENT_AMBULATORY_CARE_PROVIDER_SITE_OTHER): Payer: BC Managed Care – PPO | Admitting: Surgery

## 2022-04-08 VITALS — Ht 63.0 in | Wt 150.0 lb

## 2022-04-08 DIAGNOSIS — M5136 Other intervertebral disc degeneration, lumbar region: Secondary | ICD-10-CM

## 2022-04-08 MED ORDER — METHYLPREDNISOLONE 4 MG PO TABS: ORAL_TABLET | ORAL | 0 refills | Status: DC

## 2022-04-08 MED ORDER — METHYLPREDNISOLONE ACETATE 80 MG/ML IJ SUSP: 80.0000 mg | Freq: Once | INTRAMUSCULAR | Status: AC

## 2022-04-08 MED ORDER — KETOROLAC TROMETHAMINE 30 MG/ML IJ SOLN: 30.0000 mg | Freq: Once | INTRAMUSCULAR | Status: AC

## 2022-04-08 MED ORDER — METHOCARBAMOL 500 MG PO TABS: 500.0000 mg | ORAL_TABLET | Freq: Two times a day (BID) | ORAL | 0 refills | Status: DC | PRN

## 2022-04-08 NOTE — Addendum Note (Signed)
Addended by: Lanae Crumbly on: 04/08/2022 09:36 AM   Modules accepted: Orders

## 2022-04-08 NOTE — Progress Notes (Signed)
Office Visit Note   Patient: [name]           Date of Birth: February 19, 1971           MRN: [id number] Visit Date: 04/08/2022              Requested by: Ma Hillock, DO [address] [place],  [place] [address] PCP: Ma Hillock, DO   Assessment & Plan: Visit Diagnoses:  1. DDD (degenerative disc disease), lumbar     Plan: Today patient was given Depo-Medrol 80 mg and Toradol 30 mg IM injections in the clinic.  Sent in a prescriptions for Medrol Dosepak 6-day taper to be taken as directed and she will start this tomorrow and also Robaxin 500 mg every 12 hours as needed for spasms which she can start today.  Advised her to modify her workout activity and avoid things that have been causing stress on her back increase in her symptoms.  Follow-up me in 2 weeks for recheck.  If she has not had any improvement may consider getting lumbar MRI and compare to study that was done last year.  Follow-Up Instructions: Return in about 2 weeks (around 04/22/2022) for With Jeneen Rinks 2 weeks for recheck lumbar radiculopathy.   Orders:  Orders Placed This Encounter  Procedures   XR Lumbar Spine Complete   Meds ordered this encounter  Medications   ketorolac (TORADOL) 30 MG/ML injection 30 mg   methylPREDNISolone acetate (DEPO-MEDROL) injection 80 mg   methylPREDNISolone (MEDROL) 4 MG tablet    Sig: 6-day taper to be taken as directed    Dispense:  21 tablet    Refill:  0    Start taper April 09, 2022   methocarbamol (ROBAXIN) 500 MG tablet    Sig: Take 1 tablet (500 mg total) by mouth every 12 (twelve) hours as needed for muscle spasms.    Dispense:  50 tablet    Refill:  0      Procedures: No procedures performed   Clinical Data: No additional findings.   Subjective: Chief Complaint  Patient presents with   Lower Back - Pain    HPI 51 year old white female comes in with complaints of right buttock pain and right leg pain.  Patient has no history of right L5-S1 large  right subarticular disc protrusion resulting in severe right subarticular recess stenosis and suspected impingement of the descending right S1 nerve root that was seen on MRI August 22, 2020.  Patient did follow-up with Dr. Lorin Mercy for this and states that she has been doing very well without issues up until couple months ago when she had a flare of her symptoms.  States that she recently moved and has been packing some boxes which started her right buttock pain and pain radiating down to her knee and lower leg.  She describes this as a intermittent burning and tingling sensation.  She has tried to remain active but this is decreased some of her workouts.  Has used oral NSAID without any improvement.  Symptoms aggravated with twisting and doing planks with her workout. Review of Systems No current complaints of cardiopulmonary GI/GU issues  Objective: Vital Signs: Ht 5\' 3"  (1.6 m)   Wt 150 lb (68 kg)   BMI 26.57 kg/m   Physical Exam HENT:     Head: Normocephalic and atraumatic.     Nose: Nose normal.  Eyes:     Extraocular Movements: Extraocular movements intact.  Pulmonary:     Effort:  No respiratory distress.  Musculoskeletal:     Comments: Gait is normal.  She has low back pain with lumbar extension.  Lumbar flexion hands to ankles without difficulty.  Positive right-sided notch tenderness.  Negative the left side.  Negative logroll.  Positive right straight leg raise.  No focal motor deficits.  Heel and toe gait without difficulty.  Neurological:     Mental Status: She is alert and oriented to person, place, and time.  Psychiatric:        Mood and Affect: Mood normal.     Ortho Exam  Specialty Comments:  No specialty comments available.  Imaging: No results found.   PMFS History: Patient Active Problem List   Diagnosis Date Noted   Protrusion of lumbar intervertebral disc 08/29/2020   Hypervitaminosis D 06/28/2019   Overweight (BMI 25.0-29.9) 04/26/2019   Osteopenia  04/26/2019   Family history of colon cancer 10/02/2011   Past Medical History:  Diagnosis Date   Chicken pox    Depression    Gall stones    Herpes infection    Obstructive jaundice likely due to choledocholithiasis 10/26/2011   Osteoporosis    PONV (postoperative nausea and vomiting)     Family History  Problem Relation Age of Onset   Colon cancer Mother 56       lynch syndrome.    Pulmonary fibrosis Mother        idopathic   Depression Mother    Lung cancer Maternal Grandmother    Lung cancer Maternal Grandfather    ADD / ADHD Son    Osteoporosis Paternal Grandmother    Diabetes Paternal Grandfather    Heart attack Paternal Grandfather    Alcohol abuse Maternal Aunt     Past Surgical History:  Procedure Laterality Date   ABDOMINOPLASTY  10-06-11   11-'11Tummy Tuck   APPENDECTOMY  2004   BREAST BIOPSY Right    CESAREAN SECTION  10-06-11   '04/ '05   CHOLECYSTECTOMY  10/10/2011   Procedure: LAPAROSCOPIC CHOLECYSTECTOMY WITH INTRAOPERATIVE CHOLANGIOGRAM;  Surgeon: Maisie Fus A. Cornett, MD;  Location: [facility];  Service: General;  Laterality: N/A;  Laparoscopic Cholecystectomy and Cholangiogram   ERCP  10/25/2011   Procedure: ENDOSCOPIC RETROGRADE CHOLANGIOPANCREATOGRAPHY (ERCP);  Surgeon: Graylin Shiver, MD;  Location: [facility];  Service: Endoscopy;  Laterality: N/A;  dr Evette Cristal would like to do this case @ 1100 or earlier , but not later.. Call nurse  Pls up date with time   ERCP  10/28/2011   Procedure: ENDOSCOPIC RETROGRADE CHOLANGIOPANCREATOGRAPHY (ERCP);  Surgeon: Petra Kuba, MD;  Location: [facility];  Service: Endoscopy;  Laterality: N/A;  with stone extraction   ERCP N/A 08/23/2012   Procedure: ENDOSCOPIC RETROGRADE CHOLANGIOPANCREATOGRAPHY (ERCP);  Surgeon: Barrie Folk, MD;  Location: [facility] OR;  Service: Gastroenterology;  Laterality: N/A;   REFRACTIVE SURGERY  2020   TUBAL LIGATION  2010   WISDOM TOOTH EXTRACTION     Social History   Occupational History   Not on  file  Tobacco Use   Smoking status: Never   Smokeless tobacco: Never  Vaping Use   Vaping Use: Never used  Substance and Sexual Activity   Alcohol use: Yes    Comment: socially    Drug use: No   Sexual activity: Yes

## 2022-04-24 ENCOUNTER — Ambulatory Visit: Payer: BC Managed Care – PPO | Admitting: Surgery

## 2022-06-09 ENCOUNTER — Telehealth: Payer: Self-pay | Admitting: Surgery

## 2022-06-09 NOTE — Telephone Encounter (Signed)
Pt called requesting a referral for back MRI per last ov discussion. Please call pt at [phone].

## 2022-06-09 NOTE — Telephone Encounter (Signed)
Please advise 

## 2022-06-12 ENCOUNTER — Encounter: Payer: Self-pay | Admitting: Surgery

## 2022-06-12 ENCOUNTER — Ambulatory Visit (INDEPENDENT_AMBULATORY_CARE_PROVIDER_SITE_OTHER): Payer: BC Managed Care – PPO | Admitting: Surgery

## 2022-06-12 DIAGNOSIS — M5136 Other intervertebral disc degeneration, lumbar region: Secondary | ICD-10-CM

## 2022-06-12 DIAGNOSIS — M5116 Intervertebral disc disorders with radiculopathy, lumbar region: Secondary | ICD-10-CM | POA: Diagnosis not present

## 2022-06-12 DIAGNOSIS — M5416 Radiculopathy, lumbar region: Secondary | ICD-10-CM

## 2022-06-12 NOTE — Progress Notes (Signed)
Office Visit Note   Patient: [name]           Date of Birth: [birth year]           MRN: [id number] Visit Date: 06/12/2022              Requested by: Natalia Leatherwood, DO [address] [place],  [place] [address] PCP: Natalia Leatherwood, DO   Assessment & Plan: Visit Diagnoses:  1. DDD (degenerative disc disease), lumbar   2. Intervertebral disc disorders with radiculopathy, lumbar region   3. Radiculopathy, lumbar region     Plan: With patient's ongoing back pain and lower extreme radiculopathy that is failed conservative treatment I will schedule lumbar MRI to rule out HNP/stenosis.  Follow-up with me in a couple weeks for recheck to discuss results and further treatment options.  I will make appropriate referral at that time.  Follow-Up Instructions: Return in about 12 days (around 06/24/2022) for WITH Drewey Begue REVIEW LUMBAR MRI.   Orders:  Orders Placed This Encounter  Procedures   MR Lumbar Spine w/o contrast   No orders of the defined types were placed in this encounter.     Procedures: No procedures performed   Clinical Data: No additional findings.   Subjective: Chief Complaint  Patient presents with   Lower Back - Pain    HPI 51 year old white female returns for recheck of her low back pain and right lower extremity radiculopathy.  Last office visit patient was given a Depo-Medrol and Toradol IM injections.  Also prescribed prednisone taper.  States that she had temporary improvement for a couple of weeks but then symptoms returned.  Describes constant numbness and tingling going down to the plantar aspect of her right foot.  This is "all day and night".  Also has pain in the right buttock that radiates down the leg.  No left leg symptoms. Review of Systems No current complaints of cardiopulmonary GI/GU issues  Objective: Vital Signs: There were no vitals taken for this visit.  Physical Exam Constitutional:      Appearance: Normal appearance.  HENT:      Head: Normocephalic.     Nose: Nose normal.  Eyes:     Extraocular Movements: Extraocular movements intact.  Pulmonary:     Effort: No respiratory distress.  Musculoskeletal:     Comments: Gait is normal.  Patient has pain with lumbar extension and flexion.  Positive right sciatic notch tenderness.  Negative on the left side.  Negative logroll bilateral hips.  Nontender over the bilateral hip greater trochanter bursa.  Positive right straight leg raise.  No focal motor deficits.  Neurological:     Mental Status: She is alert and oriented to person, place, and time.  Psychiatric:        Mood and Affect: Mood normal.     Ortho Exam  Specialty Comments:  No specialty comments available.  Imaging: No results found.   PMFS History: Patient Active Problem List   Diagnosis Date Noted   Protrusion of lumbar intervertebral disc 08/29/2020   Hypervitaminosis D 06/28/2019   Overweight (BMI 25.0-29.9) 04/26/2019   Osteopenia 04/26/2019   Family history of colon cancer 10/02/2011   Past Medical History:  Diagnosis Date   Chicken pox    Depression    Gall stones    Herpes infection    Obstructive jaundice likely due to choledocholithiasis 10/26/2011   Osteoporosis    PONV (postoperative nausea and vomiting)     Family History  Problem Relation Age of Onset   Colon cancer Mother 35       lynch syndrome.    Pulmonary fibrosis Mother        idopathic   Depression Mother    Lung cancer Maternal Grandmother    Lung cancer Maternal Grandfather    ADD / ADHD Son    Osteoporosis Paternal Grandmother    Diabetes Paternal Grandfather    Heart attack Paternal Grandfather    Alcohol abuse Maternal Aunt     Past Surgical History:  Procedure Laterality Date   ABDOMINOPLASTY  10-06-11   11-'11Tummy Tuck   APPENDECTOMY  2004   BREAST BIOPSY Right    CESAREAN SECTION  10-06-11   '04/ '05   CHOLECYSTECTOMY  10/10/2011   Procedure: LAPAROSCOPIC CHOLECYSTECTOMY WITH INTRAOPERATIVE  CHOLANGIOGRAM;  Surgeon: Maisie Fus A. Cornett, MD;  Location: [facility];  Service: General;  Laterality: N/A;  Laparoscopic Cholecystectomy and Cholangiogram   ERCP  10/25/2011   Procedure: ENDOSCOPIC RETROGRADE CHOLANGIOPANCREATOGRAPHY (ERCP);  Surgeon: Graylin Shiver, MD;  Location: [facility];  Service: Endoscopy;  Laterality: N/A;  dr Evette Cristal would like to do this case @ 1100 or earlier , but not later.. Call nurse  Pls up date with time   ERCP  10/28/2011   Procedure: ENDOSCOPIC RETROGRADE CHOLANGIOPANCREATOGRAPHY (ERCP);  Surgeon: Petra Kuba, MD;  Location: [facility];  Service: Endoscopy;  Laterality: N/A;  with stone extraction   ERCP N/A 08/23/2012   Procedure: ENDOSCOPIC RETROGRADE CHOLANGIOPANCREATOGRAPHY (ERCP);  Surgeon: Barrie Folk, MD;  Location: [facility];  Service: Gastroenterology;  Laterality: N/A;   REFRACTIVE SURGERY  2020   TUBAL LIGATION  2010   WISDOM TOOTH EXTRACTION     Social History   Occupational History   Not on file  Tobacco Use   Smoking status: Never   Smokeless tobacco: Never  Vaping Use   Vaping Use: Never used  Substance and Sexual Activity   Alcohol use: Yes    Comment: socially    Drug use: No   Sexual activity: Yes

## 2022-06-13 ENCOUNTER — Ambulatory Visit
Admission: RE | Admit: 2022-06-13 | Discharge: 2022-06-13 | Disposition: A | Discharge status: Home / Self Care | Payer: BC Managed Care – PPO | Source: Ambulatory Visit | Attending: Surgery | Admitting: Surgery

## 2022-06-13 DIAGNOSIS — M5136 Other intervertebral disc degeneration, lumbar region: Secondary | ICD-10-CM

## 2022-06-13 DIAGNOSIS — M5416 Radiculopathy, lumbar region: Secondary | ICD-10-CM

## 2022-06-17 ENCOUNTER — Ambulatory Visit (INDEPENDENT_AMBULATORY_CARE_PROVIDER_SITE_OTHER): Payer: BC Managed Care – PPO | Admitting: Surgery

## 2022-06-17 ENCOUNTER — Encounter: Payer: Self-pay | Admitting: Surgery

## 2022-06-17 DIAGNOSIS — M5136 Other intervertebral disc degeneration, lumbar region: Secondary | ICD-10-CM | POA: Diagnosis not present

## 2022-06-17 DIAGNOSIS — M5416 Radiculopathy, lumbar region: Secondary | ICD-10-CM

## 2022-06-17 NOTE — Progress Notes (Unsigned)
Office Visit Note   Patient: [name]           Date of Birth: 20-Nov-1970           MRN: [id number] Visit Date: 06/17/2022              Requested by: Ma Hillock, DO [address] [place],   [address] PCP: Ma Hillock, DO   Assessment & Plan: Visit Diagnoses:  1. DDD (degenerative disc disease), lumbar   2. Radiculopathy, lumbar region     Plan: I asked Dr. Laurance Flatten to review patient's lumbar MRI.  He does agree with sending patient for 6/therapeutic right L5-S1 transforaminal ESI.  Patient will follow Dr. Laurance Flatten 2 weeks after the injection to see how she is feeling.  Patient or stands and ultimately it may come down her needing right L5-S1 microdiscectomy but hopefully she gets good enough relief with the injection.  All questions answered.  Follow-Up Instructions: No follow-ups on file.   Orders:  Orders Placed This Encounter  Procedures  . Ambulatory referral to Physical Medicine Rehab   No orders of the defined types were placed in this encounter.     Procedures: No procedures performed   Clinical Data: No additional findings.   Subjective: Chief Complaint  Patient presents with  . Lower Back - Follow-up    HPI    Objective: Vital Signs: There were no vitals taken for this visit.  Physical Exam  Ortho Exam  Specialty Comments:  No specialty comments available.  Imaging: No results found.   PMFS History: Patient Active Problem List   Diagnosis Date Noted  . Protrusion of lumbar intervertebral disc 08/29/2020  . Hypervitaminosis D 06/28/2019  . Overweight (BMI 25.0-29.9) 04/26/2019  . Osteopenia 04/26/2019  . Family history of colon cancer 10/02/2011   Past Medical History:  Diagnosis Date  . Chicken pox   . Depression   . Gall stones   . Herpes infection   . Obstructive jaundice likely due to choledocholithiasis 10/26/2011  . Osteoporosis   . PONV (postoperative nausea and vomiting)     Family History  Problem Relation  Age of Onset  . Colon cancer Mother 41       lynch syndrome.   . Pulmonary fibrosis Mother        idopathic  . Depression Mother   . Lung cancer Maternal Grandmother   . Lung cancer Maternal Grandfather   . ADD / ADHD Son   . Osteoporosis Paternal Grandmother   . Diabetes Paternal Grandfather   . Heart attack Paternal Grandfather   . Alcohol abuse Maternal Aunt     Past Surgical History:  Procedure Laterality Date  . ABDOMINOPLASTY  10-06-11   11-'11Tummy Tuck  . APPENDECTOMY  2004  . BREAST BIOPSY Right   . CESAREAN SECTION  10-06-11   '04/ '05  . CHOLECYSTECTOMY  10/10/2011   Procedure: LAPAROSCOPIC CHOLECYSTECTOMY WITH INTRAOPERATIVE CHOLANGIOGRAM;  Surgeon: Marcello Moores A. Cornett, MD;  Location: [facility];  Service: General;  Laterality: N/A;  Laparoscopic Cholecystectomy and Cholangiogram  . ERCP  10/25/2011   Procedure: ENDOSCOPIC RETROGRADE CHOLANGIOPANCREATOGRAPHY (ERCP);  Surgeon: Wonda Horner, MD;  Location: [facility];  Service: Endoscopy;  Laterality: N/A;  dr Penelope Coop would like to do this case @ 1100 or earlier , but not later.. Call nurse  Pls up date with time  . ERCP  10/28/2011   Procedure: ENDOSCOPIC RETROGRADE CHOLANGIOPANCREATOGRAPHY (ERCP);  Surgeon: Jeryl Columbia, MD;  Location: [facility];  Service: Endoscopy;  Laterality: N/A;  with stone extraction  . ERCP N/A 08/23/2012   Procedure: ENDOSCOPIC RETROGRADE CHOLANGIOPANCREATOGRAPHY (ERCP);  Surgeon: Barrie Folk, MD;  Location: [facility] OR;  Service: Gastroenterology;  Laterality: N/A;  . REFRACTIVE SURGERY  2020  . TUBAL LIGATION  2010  . WISDOM TOOTH EXTRACTION     Social History   Occupational History  . Not on file  Tobacco Use  . Smoking status: Never  . Smokeless tobacco: Never  Vaping Use  . Vaping Use: Never used  Substance and Sexual Activity  . Alcohol use: Yes    Comment: socially   . Drug use: No  . Sexual activity: Yes

## 2022-06-18 ENCOUNTER — Other Ambulatory Visit: Payer: Self-pay | Admitting: Physical Medicine and Rehabilitation

## 2022-06-18 DIAGNOSIS — M5416 Radiculopathy, lumbar region: Secondary | ICD-10-CM

## 2022-06-20 ENCOUNTER — Telehealth: Payer: Self-pay | Admitting: Physical Medicine and Rehabilitation

## 2022-06-20 NOTE — Telephone Encounter (Signed)
Pt called to set an referral appt with dr. Alvester Morin. Please call pt Monday to set appt . Pt phone number is [phone]

## 2022-06-23 ENCOUNTER — Other Ambulatory Visit: Payer: Self-pay | Admitting: Physical Medicine and Rehabilitation

## 2022-06-23 ENCOUNTER — Telehealth: Payer: Self-pay

## 2022-06-23 MED ORDER — DIAZEPAM 5 MG PO TABS: ORAL_TABLET | ORAL | 0 refills | Status: AC

## 2022-06-23 NOTE — Telephone Encounter (Signed)
Spoke with patient to schedule injection. She stated she needs the Valium called in to [facility] in [place]

## 2022-06-23 NOTE — Telephone Encounter (Signed)
Spoke with patient and informed her that the insurance has not approved the injection and we will call her once we get it approved. Verbalized understanding

## 2022-06-25 ENCOUNTER — Ambulatory Visit (INDEPENDENT_AMBULATORY_CARE_PROVIDER_SITE_OTHER): Payer: BC Managed Care – PPO | Admitting: Physical Medicine and Rehabilitation

## 2022-06-25 ENCOUNTER — Ambulatory Visit: Payer: Self-pay

## 2022-06-25 VITALS — BP 144/94 | HR 76

## 2022-06-25 DIAGNOSIS — M5416 Radiculopathy, lumbar region: Secondary | ICD-10-CM

## 2022-06-25 DIAGNOSIS — M5116 Intervertebral disc disorders with radiculopathy, lumbar region: Secondary | ICD-10-CM

## 2022-06-25 MED ORDER — METHYLPREDNISOLONE ACETATE 80 MG/ML IJ SUSP
80.0000 mg | Freq: Once | INTRAMUSCULAR | Status: AC
  Administered 2022-06-25: 80 mg

## 2022-06-25 NOTE — Progress Notes (Signed)
Numeric Pain Rating Scale and Functional Assessment Average Pain 3   In the last MONTH (on 0-10 scale) has pain interfered with the following?  1. General activity like being  able to carry out your everyday physical activities such as walking, climbing stairs, carrying groceries, or moving a chair?  Rating( varies )   +Driver, -BT, -Dye Allergies.  Pain in right leg with radiation to the toes. Numbness and tingling in leg to the toes  S1

## 2022-06-25 NOTE — Patient Instructions (Signed)

## 2022-06-26 ENCOUNTER — Ambulatory Visit: Payer: BC Managed Care – PPO | Admitting: Surgery

## 2022-07-01 NOTE — Procedures (Signed)
S1 Lumbosacral Transforaminal Epidural Steroid Injection - Sub-Pedicular Approach with Fluoroscopic Guidance   Patient: [name]      Date of Birth: Jan 02, 1971 MRN: [id number] PCP: Natalia Leatherwood, DO      Visit Date: 06/25/2022   Universal Protocol:    Date/Time: 12/19/236:19 AM  Consent Given By: the patient  Position:  PRONE  Additional Comments: Vital signs were monitored before and after the procedure. Patient was prepped and draped in the usual sterile fashion. The correct patient, procedure, and site was verified.   Injection Procedure Details:  Procedure Site One Meds Administered:  Meds ordered this encounter  Medications   methylPREDNISolone acetate (DEPO-MEDROL) injection 80 mg    Laterality: Right  Location/Site:  S1 Foramen   Needle size: 22 ga.  Needle type: Spinal  Needle Placement: Transforaminal  Findings:   -Comments: Excellent flow of contrast along the nerve, nerve root and into the epidural space.  Epidurogram: Contrast epidurogram showed no nerve root cut off or restricted flow pattern.  Procedure Details: After squaring off the sacral end-plate to get a true AP view, the C-arm was positioned so that the best possible view of the S1 foramen was visualized. The soft tissues overlying this structure were infiltrated with 2-3 ml. of 1% Lidocaine without Epinephrine.    The spinal needle was inserted toward the target using a "trajectory" view along the fluoroscope beam.  Under AP and lateral visualization, the needle was advanced so it did not puncture dura. Biplanar projections were used to confirm position. Aspiration was confirmed to be negative for CSF and/or blood. A 1-2 ml. volume of Isovue-250 was injected and flow of contrast was noted at each level. Radiographs were obtained for documentation purposes.   After attaining the desired flow of contrast documented above, a 0.5 to 1.0 ml test dose of 0.25% Marcaine was injected into each  respective transforaminal space.  The patient was observed for 90 seconds post injection.  After no sensory deficits were reported, and normal lower extremity motor function was noted,   the above injectate was administered so that equal amounts of the injectate were placed at each foramen (level) into the transforaminal epidural space.   Additional Comments:  No complications occurred Dressing: Band-Aid with 2 x 2 sterile gauze    Post-procedure details: Patient was observed during the procedure. Post-procedure instructions were reviewed.  Patient left the clinic in stable condition.

## 2022-07-01 NOTE — Progress Notes (Signed)
[name] - 51 y.o. female MRN [id number]  Date of birth: 17-Jun-1971  Office Visit Note: Visit Date: 06/25/2022 PCP: Natalia Leatherwood, DO Referred by: Natalia Leatherwood, DO  Subjective: Chief Complaint  Patient presents with   Lower Back - Pain   HPI:  [name] is a 51 y.o. female who comes in today at the request of Zonia Kief, PA-C for planned Right S1-2 Lumbar Transforaminal epidural steroid injection with fluoroscopic guidance.  The patient has failed conservative care including home exercise, medications, time and activity modification.  This injection will be diagnostic and hopefully therapeutic.  Please see requesting physician notes for further details and justification. MRI reviewed with images and spine model.  MRI reviewed in the note below. S1 dermatome symptoms.    ROS Otherwise per HPI.  Assessment & Plan: Visit Diagnoses:    ICD-10-CM   1. Lumbar radiculopathy  M54.16 XR C-ARM NO REPORT    Epidural Steroid injection    methylPREDNISolone acetate (DEPO-MEDROL) injection 80 mg    2. Radiculopathy due to lumbar intervertebral disc disorder  M51.16       Plan: No additional findings.   Meds & Orders:  Meds ordered this encounter  Medications   methylPREDNISolone acetate (DEPO-MEDROL) injection 80 mg    Orders Placed This Encounter  Procedures   XR C-ARM NO REPORT   Epidural Steroid injection    Follow-up: Return for visit to requesting provider as needed.   Procedures: No procedures performed  S1 Lumbosacral Transforaminal Epidural Steroid Injection - Sub-Pedicular Approach with Fluoroscopic Guidance   Patient: [name]      Date of Birth: 1970/07/30 MRN: [id number] PCP: Natalia Leatherwood, DO      Visit Date: 06/25/2022   Universal Protocol:    Date/Time: 12/19/236:19 AM  Consent Given By: the patient  Position:  PRONE  Additional Comments: Vital signs were monitored before and after the procedure. Patient was prepped and draped in  the usual sterile fashion. The correct patient, procedure, and site was verified.   Injection Procedure Details:  Procedure Site One Meds Administered:  Meds ordered this encounter  Medications   methylPREDNISolone acetate (DEPO-MEDROL) injection 80 mg    Laterality: Right  Location/Site:  S1 Foramen   Needle size: 22 ga.  Needle type: Spinal  Needle Placement: Transforaminal  Findings:   -Comments: Excellent flow of contrast along the nerve, nerve root and into the epidural space.  Epidurogram: Contrast epidurogram showed no nerve root cut off or restricted flow pattern.  Procedure Details: After squaring off the sacral end-plate to get a true AP view, the C-arm was positioned so that the best possible view of the S1 foramen was visualized. The soft tissues overlying this structure were infiltrated with 2-3 ml. of 1% Lidocaine without Epinephrine.    The spinal needle was inserted toward the target using a "trajectory" view along the fluoroscope beam.  Under AP and lateral visualization, the needle was advanced so it did not puncture dura. Biplanar projections were used to confirm position. Aspiration was confirmed to be negative for CSF and/or blood. A 1-2 ml. volume of Isovue-250 was injected and flow of contrast was noted at each level. Radiographs were obtained for documentation purposes.   After attaining the desired flow of contrast documented above, a 0.5 to 1.0 ml test dose of 0.25% Marcaine was injected into each respective transforaminal space.  The patient was observed for 90 seconds post injection.  After no sensory deficits were reported, and  normal lower extremity motor function was noted,   the above injectate was administered so that equal amounts of the injectate were placed at each foramen (level) into the transforaminal epidural space.   Additional Comments:  No complications occurred Dressing: Band-Aid with 2 x 2 sterile gauze    Post-procedure  details: Patient was observed during the procedure. Post-procedure instructions were reviewed.  Patient left the clinic in stable condition.    Clinical History: MRI LUMBAR SPINE WITHOUT CONTRAST   TECHNIQUE: Multiplanar, multisequence MR imaging of the lumbar spine was performed. No intravenous contrast was administered.   COMPARISON:  MRI of the lumbar spine August 22, 2020   FINDINGS: Segmentation:  Standard.   Alignment:  Maintained.   Vertebrae: No fracture, evidence of discitis, or aggressive bone lesion.   Conus medullaris and cauda equina: Conus extends to the L1-2 level. Conus and cauda equina appear normal.   Paraspinal and other soft tissues: Negative.   Disc levels:   T12-L1: No spinal canal or neural foraminal stenosis.   L1-2: No spinal canal or neural foraminal stenosis.   L2-3: No spinal canal or neural foraminal stenosis.   L3-4: Tiny central disc protrusion mild facet degenerative changes. No significant spinal canal or neural foraminal stenosis.   L4-5: Loss of disc height, disc bulge with superimposed small left central disc protrusion, mild facet degenerative changes, left greater than right, and mild ligamentum flavum redundancy. Findings result in mild spinal canal stenosis with mild right and moderate left subarticular zone stenosis and mild bilateral neural foraminal narrowing. Findings have not significantly changed.   L5-S1: Loss of disc height, disc bulge with superimposed right central disc protrusion resulting in moderate narrowing of the right subarticular zone and causing mild displacement of the traversing right S1 nerve root mild right facet degenerative changes and mild right neural foraminal narrowing. The disc protrusion appears similar to slightly smaller when compared to prior MRI.   IMPRESSION: 1. At L4-5, mild spinal canal stenosis with mild right and moderate left subarticular zone stenosis and mild bilateral neural  foraminal narrowing, unchanged. 2. At L5-S1, right central disc protrusion resulting in moderate narrowing of the right subarticular zone and displacement of the traversing right S1 nerve root, similar to slightly smaller when compared to prior MRI.     Electronically Signed   By: Pedro Earls M.D.   On: 06/13/2022 12:37     Objective:  VS:  HT:    WT:   BMI:     BP:(!) 144/94  HR:76bpm  TEMP: ( )  RESP:  Physical Exam Vitals and nursing note reviewed.  Constitutional:      General: She is not in acute distress.    Appearance: Normal appearance. She is not ill-appearing.  HENT:     Head: Normocephalic and atraumatic.     Right Ear: External ear normal.     Left Ear: External ear normal.  Eyes:     Extraocular Movements: Extraocular movements intact.  Cardiovascular:     Rate and Rhythm: Normal rate.     Pulses: Normal pulses.  Pulmonary:     Effort: Pulmonary effort is normal. No respiratory distress.  Abdominal:     General: There is no distension.     Palpations: Abdomen is soft.  Musculoskeletal:        General: Tenderness present.     Cervical back: Neck supple.     Right lower leg: No edema.     Left lower leg: No  edema.     Comments: Patient has good distal strength with no pain over the greater trochanters.  No clonus or focal weakness.  Positive slump test on the right  Skin:    Findings: No erythema, lesion or rash.  Neurological:     General: No focal deficit present.     Mental Status: She is alert and oriented to person, place, and time.     Sensory: Sensory deficit present.     Motor: No weakness or abnormal muscle tone.     Coordination: Coordination normal.     Comments: Impaired sensation right S1 dermatome  Psychiatric:        Mood and Affect: Mood normal.        Behavior: Behavior normal.      Imaging: No results found.

## 2022-07-11 ENCOUNTER — Encounter: Payer: Self-pay | Admitting: Orthopedic Surgery

## 2022-07-11 ENCOUNTER — Ambulatory Visit (INDEPENDENT_AMBULATORY_CARE_PROVIDER_SITE_OTHER): Payer: BC Managed Care – PPO | Admitting: Orthopedic Surgery

## 2022-07-11 VITALS — BP 144/94 | HR 88 | Ht 63.0 in | Wt 150.0 lb

## 2022-07-11 DIAGNOSIS — M5126 Other intervertebral disc displacement, lumbar region: Secondary | ICD-10-CM | POA: Diagnosis not present

## 2022-07-11 MED ORDER — GABAPENTIN 300 MG PO CAPS: 300.0000 mg | ORAL_CAPSULE | Freq: Three times a day (TID) | ORAL | 0 refills | Status: AC

## 2022-07-11 NOTE — Progress Notes (Signed)
Orthopedic Spine Surgery Office Note  Assessment: Patient is a 51 y.o. female with right leg pain. Goes down the posterior aspect and into the plantar aspect of the foot. Has a right sided paracentral disc herniation at L5/S1.    Plan: -Explained that initially conservative treatment is tried as a significant number of patients may experience relief with these treatment modalities. Discussed that the conservative treatments include:  -activity modification  -physical therapy  -over the counter pain medications  -medrol dosepak  -gabapentin  -lumbar steroid injections -Patient has tried chiropractor, Advil, Medrol Dosepak, steroid injection -She has not tried gabapentin so I offered that as a potential treatment for her.  We also discussed surgery in the form of an L5/S1 microdiscectomy.  I covered the risk benefits and alternatives of surgery.  Afterwards, she wanted to proceed with a trial of gabapentin.  This was prescribed to her today. -Patient should return to office in 4 weeks, x-rays at next visit: none   ___________________________________________________________________________   History:  Patient is a 51 y.o. female who presents today for lumbar spine.  Patient noted onset of right leg pain about 2 years ago.  There is no trauma or injury that brought on the pain.  Pain was felt along the posterior aspect of the thigh into the posterior leg and into the plantar aspect of the foot.  All of her symptoms are on the right side.  She is not having any back pain.  No left-sided leg symptoms.  Pain is felt on a daily basis.  She states it is worse if she straightens out the leg.  She gives the example of trying to put on socks.  It is not relieved if she rests.  She tried going to the chiropractor and did not get any relief with that.  She tried Advil and that did not seem to help.  She did a Medrol Dosepak and that seemed to relieve the pain.  The pain went away for many months but then  recurred in August 2023.  She states she was moving around that time and was lifting heavy objects.  Pain is felt in the same exact distribution as before.  She also now has numbness and tingling down the posterior aspect of the leg.  She has tried Advil, home exercises, another round of Medrol Dosepak, and a steroid injection.  The steroid injection provided her with about 50% relief for a couple of days but now the pain is starting to come back with the same intensity.  Still has no symptoms on the left side and still no back pain.   Weakness: denies Symptoms of imbalance: denies Paresthesias and numbness: yes, down the posterior aspect of the right leg Bowel or bladder incontinence: Denies Saddle anesthesia: Denies  Treatments tried: Chiropractor, activity modification, Advil, Medrol Dosepak, steroid injection  Review of systems: Denies fevers and chills, night sweats, unexplained weight loss, history of cancer, pain that wakes them at night  Past medical history: Osteopenia  Allergies: NKDA  Past surgical history:  Cholecystectomy Appendectomy C-section x 2 Tummy tuck  Social history: Denies use of nicotine product (smoking, vaping, patches, smokeless) Alcohol use: Yes, rare Denies recreational drug use   Physical Exam:  General: no acute distress, appears stated age Neurologic: alert, answering questions appropriately, following commands Respiratory: unlabored breathing on room air, symmetric chest rise Psychiatric: appropriate affect, normal cadence to speech   MSK (spine):  -Strength exam      Left  Right EHL  5/5  5/5 TA    5/5  5/5 GSC    5/5  5/5 Knee extension  5/5  5/5 Hip flexion   5/5  4+/5  -Sensory exam    Sensation intact to light touch in L3-S1 nerve distributions of bilateral lower extremities  -Achilles DTR: 2/4 on the left, 2/4 on the right -Patellar tendon DTR: 2/4 on the left, 2/4 on the right  -Straight leg raise:  positive -Contralateral straight leg raise: negative -Femoral nerve stretch test: negative -Clonus: no beats bilaterally  -Left hip exam: No pain to range of motion, negative Stinchfield -Right hip exam: No pain to range of motion, negative Stinchfield  Imaging: XR of the lumbar spine from 04/08/2022 was independently reviewed and interpreted, showing disc height loss at L5/S1. No fracture or dislocation. No evidence of instability on flexion/extension.   MRI of the lumbar spine from 06/13/2022 was independently reviewed and interpreted, showing DDD at L4/5 and L5/S1. Right paracentral disc herniation at L5/S1.    Patient name: [name] Patient MRN: [id number] Date of visit: 07/11/22

## 2022-08-11 ENCOUNTER — Ambulatory Visit: Payer: BC Managed Care – PPO | Admitting: Orthopedic Surgery

## 2024-07-11 ENCOUNTER — Other Ambulatory Visit: Payer: Self-pay | Admitting: Obstetrics and Gynecology

## 2024-07-11 DIAGNOSIS — R928 Other abnormal and inconclusive findings on diagnostic imaging of breast: Secondary | ICD-10-CM

## 2024-07-21 ENCOUNTER — Ambulatory Visit

## 2024-07-21 ENCOUNTER — Ambulatory Visit
Admission: RE | Admit: 2024-07-21 | Discharge: 2024-07-21 | Disposition: A | Discharge status: Home / Self Care | Source: Ambulatory Visit | Attending: Obstetrics and Gynecology | Admitting: Obstetrics and Gynecology

## 2024-07-21 ENCOUNTER — Other Ambulatory Visit: Payer: Self-pay | Admitting: Obstetrics and Gynecology

## 2024-07-21 DIAGNOSIS — N6310 Unspecified lump in the right breast, unspecified quadrant: Secondary | ICD-10-CM

## 2024-07-21 DIAGNOSIS — R928 Other abnormal and inconclusive findings on diagnostic imaging of breast: Secondary | ICD-10-CM

## 2024-07-22 ENCOUNTER — Ambulatory Visit
Admission: RE | Admit: 2024-07-22 | Discharge: 2024-07-22 | Disposition: A | Source: Ambulatory Visit | Attending: Obstetrics and Gynecology | Admitting: Obstetrics and Gynecology

## 2024-07-22 ENCOUNTER — Other Ambulatory Visit: Payer: Self-pay | Admitting: Obstetrics and Gynecology

## 2024-07-22 DIAGNOSIS — N6310 Unspecified lump in the right breast, unspecified quadrant: Secondary | ICD-10-CM

## 2024-07-22 DIAGNOSIS — R928 Other abnormal and inconclusive findings on diagnostic imaging of breast: Secondary | ICD-10-CM
